# Patient Record
Sex: Female | Born: 1960 | Race: White | Hispanic: No | Marital: Married | Smoking: Current every day smoker
Health system: Southern US, Community
[De-identification: ages and names within clinical notes are randomized; demographics above are authoritative.]

## PROBLEM LIST (undated history)

## (undated) DIAGNOSIS — H539 Unspecified visual disturbance: Secondary | ICD-10-CM

## (undated) DIAGNOSIS — K769 Liver disease, unspecified: Secondary | ICD-10-CM

## (undated) DIAGNOSIS — C801 Malignant (primary) neoplasm, unspecified: Secondary | ICD-10-CM

## (undated) DIAGNOSIS — G35 Multiple sclerosis: Secondary | ICD-10-CM

## (undated) HISTORY — PX: EYE SURGERY: SHX253

## (undated) HISTORY — DX: Multiple sclerosis: G35

## (undated) HISTORY — PX: PARTIAL HYSTERECTOMY: SHX80

## (undated) HISTORY — DX: Unspecified visual disturbance: H53.9

## (undated) HISTORY — DX: Liver disease, unspecified: K76.9

---

## 2001-07-13 ENCOUNTER — Other Ambulatory Visit: Admission: RE | Admit: 2001-07-13 | Discharge: 2001-07-13 | Payer: Self-pay | Admitting: Obstetrics and Gynecology

## 2013-02-04 DIAGNOSIS — R5381 Other malaise: Secondary | ICD-10-CM | POA: Insufficient documentation

## 2013-02-04 DIAGNOSIS — E559 Vitamin D deficiency, unspecified: Secondary | ICD-10-CM | POA: Insufficient documentation

## 2013-08-17 DIAGNOSIS — F4329 Adjustment disorder with other symptoms: Secondary | ICD-10-CM | POA: Insufficient documentation

## 2016-05-28 DIAGNOSIS — G35 Multiple sclerosis: Secondary | ICD-10-CM | POA: Insufficient documentation

## 2017-01-20 ENCOUNTER — Encounter: Payer: Self-pay | Admitting: Neurology

## 2017-01-20 ENCOUNTER — Encounter (INDEPENDENT_AMBULATORY_CARE_PROVIDER_SITE_OTHER): Payer: Self-pay

## 2017-01-20 ENCOUNTER — Ambulatory Visit (INDEPENDENT_AMBULATORY_CARE_PROVIDER_SITE_OTHER): Payer: BLUE CROSS/BLUE SHIELD | Admitting: Neurology

## 2017-01-20 VITALS — BP 104/62 | HR 68 | Resp 16 | Ht 67.0 in | Wt 142.5 lb

## 2017-01-20 DIAGNOSIS — F09 Unspecified mental disorder due to known physiological condition: Secondary | ICD-10-CM | POA: Insufficient documentation

## 2017-01-20 DIAGNOSIS — R5381 Other malaise: Secondary | ICD-10-CM | POA: Diagnosis not present

## 2017-01-20 DIAGNOSIS — R269 Unspecified abnormalities of gait and mobility: Secondary | ICD-10-CM | POA: Diagnosis not present

## 2017-01-20 DIAGNOSIS — G35 Multiple sclerosis: Secondary | ICD-10-CM

## 2017-01-20 DIAGNOSIS — R5382 Chronic fatigue, unspecified: Secondary | ICD-10-CM | POA: Diagnosis not present

## 2017-01-20 DIAGNOSIS — R748 Abnormal levels of other serum enzymes: Secondary | ICD-10-CM

## 2017-01-20 DIAGNOSIS — F4329 Adjustment disorder with other symptoms: Secondary | ICD-10-CM | POA: Diagnosis not present

## 2017-01-20 MED ORDER — ARMODAFINIL 200 MG PO TABS: ORAL_TABLET | ORAL | 5 refills | Status: DC

## 2017-01-20 NOTE — Progress Notes (Signed)
[facility]  PATIENT: Michele Sawyer DOB: 10/11/1960  REFERRING DOCTOR OR PCP:  [name], PA-C SOURCE: patient, notes from Ms. Rose, imaging and laboratory reports, MRI images on PACS  _________________________________   HISTORICAL  CHIEF COMPLAINT:  Chief Complaint  Patient presents with  . Multiple Sclerosis    Michele Sawyer is here with [name] for 2nd opinion regarding MS tx.  Dx. in 2013, confirmed with MRI and LP.  Presenting sx. was fatigue, esp. in legs. Has been followed by [name], PA-C at [facility] in [place].  Initially was on Rebif; stopped after 3 yrs. due to inj. site fatigue.  Then started Gilenya, which she is still on, tolerates well and denies progression of sx./dz. Sts. lft's have been progreesively getting higher.  She has had a liver bx which was ok.  Sts. they   . High Risk Medication    would like RAS opinion on cause of high lft's--if Gilenya may be the cause and if so, if she needs to change MS meds.[place]  . Elevated LFT's    HISTORY OF PRESENT ILLNESS:  I had the pleasure seeing you patient, Michele Sawyer, at the [place] center at [facility] for a neurologic consultation regarding her multiple sclerosis and disease modifying therapies.  She is a 56 year old woman who was diagnosed with multiple sclerosis in 2012 after presenting with a several year history of progressive leg weakness with some spasticity. At times her legs would give out. This was always worse when the temperatures were elevated. She saw [facility]. She had an MRI showing changes consistent with MS. A lumbar puncture was performed and she had an elevated IgG index and greater than 5 oligoclonal bands. This helped to confirm her diagnosis of multiple sclerosis. She was started on Rebif. Although her symptoms seemed to stabilize, she had difficulty tolerating the injections noting more tired and having some skin reactions.  Around 2015, she was switched to [place]. She tolerates Gilenya well and her MS appears to be stable with no definite progression. However, she has had slowly progressive increasing alkaline phosphatase. Her levels are not that high and the last couple have been in the 150 range. Because of the progression, she was referred to gastroenterology. A liver biopsy was performed and it showed a small amount of inflammation with increased eosinophils. This was nonspecific but could be consistent with a reaction from the drug.  Initially, her's symptoms appear to be progressive starting several years before her diagnosis. She did have one episode, lasting 3 days, where she had the onset of more severe weakness in her legs while she was at work a year before diagnosis. This lasted about 3 days and then improved.   She had a second episode while she was on Rebif that also lasted a few days where her cognition was much worse and she needed to take time off work.   Gait/strength/sensation: Her main problem since before her diagnosis has been difficulty with her walking. Specifically, the right leg is weak and stiff. It gives out at times. This slowly worsened before her diagnosis but has been mostly stable with the last couple years. She is able to climb a flight of stairs if she holds on. She would have difficulty with the latter. At times she seems more off balance than other times and there is some decreased coordination, especially in the right leg. She notes some tingling in the legs but there is not a  significant discomfort.  Bladder/bowel: She reports urinary frequency and urgency and will have incontinence now and then when she can't get to the bathroom in time. She denies any bowel issues.  Vision: She denies any major difficulty with her vision. She does not have diplopia. She wears glasses.  Fatigue/sleep: She has had a lot of difficulty with fatigue in this predated her diagnosis by a couple of years. She  has both physical and cognitive fatigue. This is much worse when she is in hot temperatures and also worsens as the day goes on.   She sleeps well at night but also takes naps during the day. She does not snore or have any sleep apnea symptoms. She wakes up once a night to use the bathroom.  Mood/cognition: She has had some mild depression with some irritability. No anxiety.  She is on Effexor at relatively low dose of 37.5 most days and 75 mg some days. She has had some mild difficulties with cognition. Specifically she has had some difficulties with verbal fluency, executive function, focus and attention.  On 5 measures between 12/05/2014 and 12/22/2016, AST and ALT were within normal limits. Phosphatase was increased most of the time and progressively increase the last year 143-150-153.  Tissue specific alkaline phosphatase did not show any definite elevation though the liver was in the high normal range.    On 06/04/2015 and 07/03/2015, the absolute lymphocyte count was 0.2. On 5 measures between 12/02/2015 for 20 12/18/2016, the absolute lymphocyte count was 0.4.   Vitamin D was 56.05 May 2014.  I personally reviewed the MRI images of the brain dated 07/06/2016 and 06/13/2015. They show T2/FLAIR hyperintense foci, many in the periventricular white matter in a pattern and configuration consistent with MS. There was no change between the 2 MRIs.  REVIEW OF SYSTEMS: Constitutional: No fevers, chills, sweats, or change in appetite.  She has fatigue Eyes: No visual changes, double vision, eye pain Ear, nose and throat: No hearing loss, ear pain, nasal congestion, sore throat Cardiovascular: No chest pain, palpitations Respiratory: No shortness of breath at rest or with exertion.   No wheezes GastrointestinaI: No nausea, vomiting, diarrhea, abdominal pain, fecal incontinence Genitourinary: No dysuria, urinary retention or frequency.  No nocturia. Musculoskeletal: No neck pain, back  pain Integumentary: No rash, pruritus, skin lesions Neurological: as above Psychiatric: Notes mild depression and anxiety Endocrine: No palpitations, diaphoresis, change in appetite, change in weigh or increased thirst Hematologic/Lymphatic: No anemia, purpura, petechiae. Allergic/Immunologic: No itchy/runny eyes, nasal congestion, recent allergic reactions, rashes  ALLERGIES: Allergies  Allergen Reactions  . Penicillins Swelling    Swell rash    HOME MEDICATIONS:  Current Outpatient Prescriptions:  .  baclofen (LIORESAL) 10 MG tablet, Take 10 mg by mouth., Disp: , Rfl:  .  calcium citrate-vitamin D (CITRACAL+D) 315-200 MG-UNIT tablet, Take 1 tablet by mouth 2 (two) times daily., Disp: , Rfl:  .  Cholecalciferol (VITAMIN D3) 3000 units TABS, Take by mouth., Disp: , Rfl:  .  Fingolimod HCl (GILENYA) 0.5 MG CAPS, Take by mouth., Disp: , Rfl:  .  venlafaxine XR (EFFEXOR-XR) 37.5 MG 24 hr capsule, Take 1 capsule by mouth once daily for 1 week, then increase to 2 capsules once daily thereafter., Disp: , Rfl:  .  Armodafinil 200 MG TABS, 1/2 to 1 pill po qAm, Disp: 30 tablet, Rfl: 5  PAST MEDICAL HISTORY: Past Medical History:  Diagnosis Date  . Liver disease   . Multiple sclerosis ([place])   . Vision  abnormalities     PAST SURGICAL HISTORY: Past Surgical History:  Procedure Laterality Date  . EYE SURGERY Left    orbital floor reconstruction following softball accident  . PARTIAL HYSTERECTOMY      FAMILY HISTORY: Family History  Problem Relation Age of Onset  . Diabetes Mother   . Bone cancer Father     SOCIAL HISTORY:  Social History   Social History  . Marital status: Married    Spouse name: N/A  . Number of children: N/A  . Years of education: N/A   Occupational History  . Not on file.   Social History Main Topics  . Smoking status: Current Every Day Smoker    Types: Cigarettes  . Smokeless tobacco: Never Used  . Alcohol use No  . Drug use: No  . Sexual  activity: Not on file   Other Topics Concern  . Not on file   Social History Narrative  . No narrative on file     PHYSICAL EXAM  Vitals:   01/20/17 0907  BP: 104/62  Pulse: 68  Resp: 16  Weight: 142 lb 8 oz (64.6 kg)  Height: 5\' 7"  (1.702 m)    Body mass index is 22.32 kg/m.   General: The patient is well-developed and well-nourished and in no acute distress  Eyes:  Funduscopic exam shows normal optic discs and retinal vessels.  Neck: The neck is supple, no carotid bruits are noted.  The neck is nontender.  Cardiovascular: The heart has a regular rate and rhythm with a normal S1 and S2. There were no murmurs, gallops or rubs. Lungs are clear to auscultation.  Skin: Extremities are without significant edema.  Musculoskeletal:  Back is nontender  Neurologic Exam  Mental status: The patient is alert and oriented x 3 at the time of the examination. The patient has apparent normal recent and remote memory, with reduced normal attention span (WORLD-DLORW; 100-92?; 20-17-?).   Speech is normal.  Cranial nerves: Extraocular movements are full. Pupils are equal, round, and reactive to light and accomodation.  Visual fields are full.  Facial symmetry is present. There is good facial sensation to soft touch bilaterally.Facial strength is normal.  Trapezius and sternocleidomastoid strength is normal. No dysarthria is noted.  The tongue is midline, and the patient has symmetric elevation of the soft palate. No obvious hearing deficits are noted.  Motor:  Muscle bulk is normal.   Tone is slightly increased on the right leg. l. Strength is  5 / 5 in all 4 extremities except 4+/5 foot/toe extension on her right.   Sensory: Sensory testing is intact to pinprick, soft touch and vibration sensation in all 4 extremities.  Coordination: Cerebellar testing reveals good finger-nose-finger and heel-to-shin bilaterally.  Gait and station: Station is normal.   The gait shows a very mild right  foot drop. She has difficulty with tandem gait. Romberg is negative.   Reflexes: Deep tendon reflexes are symmetric in the arms and 3 in the left leg. She had spread at the right knee and 2 beats of nonsustained clonus at the right ankle. The right plantar response was extensor and the left response was flexor.Marland Kitchen       DIAGNOSTIC DATA (LABS, IMAGING, TESTING) - I reviewed patient records, labs, notes, testing and imaging myself where available.      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 She had slow worsening of gait for a couple of years before her diagnosis but has been mostly stable since the diagnosis. She had one possible exacerbation but most of her changes were more gradual. I discussed with her and her husband that she more likely has relapsing remitting MS but I cannot be certain that she does not have primary progressive MS.   Gilenya can lead to liver function abnormalities and this could be related, though we cannot be certain. I am going to have her cut her dose down to 1 pill every other day and to be retested in 6 - 8 weeks (I placed an order for [facility] if this helps the liver function tests and her MS remains stable, she can continue on this regimen and consider going back to a daily dose sometime in the future. If there is no change in the alkaline phosphatase, we could consider switching to a different medication. Options could include Tecfidera in ocrelizumab.  Decreased there is some possibility that her course is primary progressive, I would favor ocrelizumab if a  change of medication is required. I discussed the risks and benefits of that option.  She has several symptoms related to her MS including cognitive changes and significant fatigue. I will have her try Nuvigil to see if that can help her fatigue and sleepiness. If it does, it may also help her cognitive issues mildly. Stimulants could also be considered.  I will be happy to see her back as needed if she has significant changes or if other questions arise.  Thank you for asking me to see Michele Sawyer. Please let me know if I can be of further assistance with her or other patients in the future.   Robynn Marcel A. Felecia Shelling, MD, PhD 9/45/0388, 8:28 PM Certified in Neurology, Clinical Neurophysiology, Sleep Medicine, Pain Medicine and Neuroimaging  [facility] [address] [place], [place] [address] [phone]

## 2017-01-28 ENCOUNTER — Telehealth: Payer: Self-pay | Admitting: *Deleted

## 2017-01-28 NOTE — Telephone Encounter (Signed)
PA for Armodafinil 200mg  once daily completed and faxed to [facility] [place].  Dx. ADD F98.8/fim

## 2017-02-01 NOTE — Telephone Encounter (Signed)
I have spoken with Shayla with [employer] and advised no other meds tried or failed for poor focus/concentration related to MS/fim

## 2017-02-01 NOTE — Telephone Encounter (Signed)
Shayla/BC [phone] has a few questions reg PA.

## 2017-02-04 ENCOUNTER — Telehealth: Payer: Self-pay | Admitting: *Deleted

## 2017-02-04 NOTE — Telephone Encounter (Signed)
LMOM (identified vm) for pt. to call.  Insurance has denied Nuvigil.  Per RAS, ok for Phentermine 30mg  once daily if pt. would like. I can fax rx. to her pharmacy/fim

## 2017-02-04 NOTE — Telephone Encounter (Signed)
Received fax from [facility] [place].  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

## 2017-02-05 MED ORDER — PHENTERMINE HCL 30 MG PO CAPS: 30.0000 mg | ORAL_CAPSULE | ORAL | 5 refills | Status: DC

## 2017-02-05 NOTE — Telephone Encounter (Signed)
Rx.  faxed to [facility]/fim 

## 2017-02-05 NOTE — Telephone Encounter (Signed)
Rx. awaiting RAS sig/fim 

## 2017-02-05 NOTE — Telephone Encounter (Signed)
Pt husband calling back, he has been made aware of Phentermine 30mg  once daily  And would like to move forward.  Please process he then said he will contact [facility] to find out cost

## 2017-02-05 NOTE — Addendum Note (Signed)
Addended by: France Ravens on: 02/05/2017 10:49 AM   Modules accepted: Orders

## 2017-03-05 ENCOUNTER — Telehealth: Payer: Self-pay | Admitting: Neurology

## 2017-03-05 ENCOUNTER — Other Ambulatory Visit: Payer: Self-pay | Admitting: Neurology

## 2017-03-05 DIAGNOSIS — R748 Abnormal levels of other serum enzymes: Secondary | ICD-10-CM

## 2017-03-05 DIAGNOSIS — G35 Multiple sclerosis: Secondary | ICD-10-CM

## 2017-03-05 DIAGNOSIS — Z79899 Other long term (current) drug therapy: Secondary | ICD-10-CM

## 2017-03-05 NOTE — Telephone Encounter (Signed)
I have spoken with Clayton his afternoon.  She has been on Gilenya QOD for 6 wks--it is time to recheck lft's to see if they have come down.  Order faxed to [facility] in [place], phone# [phone], fax# [phone]/fim

## 2017-03-05 NOTE — Telephone Encounter (Signed)
Patient called and requested we send her lab work over to [facility] in [place] at [address], [place], [place] [address].

## 2017-03-06 LAB — HEPATIC FUNCTION PANEL
ALBUMIN: 4.2 g/dL (ref 3.5–5.5)
ALK PHOS: 136 IU/L — AB (ref 39–117)
ALT: 24 IU/L (ref 0–32)
AST: 23 IU/L (ref 0–40)
BILIRUBIN, DIRECT: 0.12 mg/dL (ref 0.00–0.40)
Bilirubin Total: 0.3 mg/dL (ref 0.0–1.2)
TOTAL PROTEIN: 6.8 g/dL (ref 6.0–8.5)

## 2017-03-12 ENCOUNTER — Telehealth: Payer: Self-pay | Admitting: *Deleted

## 2017-03-12 NOTE — Telephone Encounter (Signed)
-----   Message from Britt Bottom, MD sent at 03/11/2017  4:46 PM EDT ----- The liver function tests look okay. The alkaline phosphatase was minimally elevated but this is not concerning.

## 2017-03-12 NOTE — Telephone Encounter (Signed)
I have spoken with Michele Sawyer and per RAS, reviewed lab results as below.  As she is continuing care with [name], PA-C and [name], I have faxed results to [facility] at fax# [phone]

## 2020-04-25 ENCOUNTER — Emergency Department (HOSPITAL_COMMUNITY): Payer: Medicare Other

## 2020-04-25 ENCOUNTER — Other Ambulatory Visit: Payer: Self-pay

## 2020-04-25 ENCOUNTER — Encounter (HOSPITAL_COMMUNITY): Payer: Self-pay

## 2020-04-25 ENCOUNTER — Inpatient Hospital Stay (HOSPITAL_COMMUNITY)
Admission: EM | Admit: 2020-04-25 | Discharge: 2020-04-30 | DRG: 054 | Disposition: A | Discharge status: Hospice | Payer: Medicare Other | Attending: Internal Medicine | Admitting: Internal Medicine

## 2020-04-25 DIAGNOSIS — G35 Multiple sclerosis: Secondary | ICD-10-CM | POA: Diagnosis present

## 2020-04-25 DIAGNOSIS — R569 Unspecified convulsions: Secondary | ICD-10-CM

## 2020-04-25 DIAGNOSIS — C7971 Secondary malignant neoplasm of right adrenal gland: Secondary | ICD-10-CM | POA: Diagnosis present

## 2020-04-25 DIAGNOSIS — Z90711 Acquired absence of uterus with remaining cervical stump: Secondary | ICD-10-CM

## 2020-04-25 DIAGNOSIS — R29810 Facial weakness: Secondary | ICD-10-CM | POA: Diagnosis present

## 2020-04-25 DIAGNOSIS — R4701 Aphasia: Secondary | ICD-10-CM | POA: Diagnosis present

## 2020-04-25 DIAGNOSIS — R531 Weakness: Secondary | ICD-10-CM

## 2020-04-25 DIAGNOSIS — M84459A Pathological fracture, hip, unspecified, initial encounter for fracture: Secondary | ICD-10-CM | POA: Diagnosis present

## 2020-04-25 DIAGNOSIS — C801 Malignant (primary) neoplasm, unspecified: Secondary | ICD-10-CM | POA: Diagnosis present

## 2020-04-25 DIAGNOSIS — E872 Acidosis: Secondary | ICD-10-CM | POA: Diagnosis present

## 2020-04-25 DIAGNOSIS — Z978 Presence of other specified devices: Secondary | ICD-10-CM

## 2020-04-25 DIAGNOSIS — Z4659 Encounter for fitting and adjustment of other gastrointestinal appliance and device: Secondary | ICD-10-CM

## 2020-04-25 DIAGNOSIS — G9341 Metabolic encephalopathy: Secondary | ICD-10-CM | POA: Diagnosis present

## 2020-04-25 DIAGNOSIS — E162 Hypoglycemia, unspecified: Secondary | ICD-10-CM | POA: Diagnosis not present

## 2020-04-25 DIAGNOSIS — Z20822 Contact with and (suspected) exposure to covid-19: Secondary | ICD-10-CM | POA: Diagnosis present

## 2020-04-25 DIAGNOSIS — Z833 Family history of diabetes mellitus: Secondary | ICD-10-CM

## 2020-04-25 DIAGNOSIS — G40901 Epilepsy, unspecified, not intractable, with status epilepticus: Secondary | ICD-10-CM

## 2020-04-25 DIAGNOSIS — Z681 Body mass index (BMI) 19 or less, adult: Secondary | ICD-10-CM

## 2020-04-25 DIAGNOSIS — J9601 Acute respiratory failure with hypoxia: Secondary | ICD-10-CM | POA: Diagnosis present

## 2020-04-25 DIAGNOSIS — R739 Hyperglycemia, unspecified: Secondary | ICD-10-CM | POA: Diagnosis present

## 2020-04-25 DIAGNOSIS — Z923 Personal history of irradiation: Secondary | ICD-10-CM

## 2020-04-25 DIAGNOSIS — C7931 Secondary malignant neoplasm of brain: Secondary | ICD-10-CM | POA: Diagnosis not present

## 2020-04-25 DIAGNOSIS — Z7189 Other specified counseling: Secondary | ICD-10-CM

## 2020-04-25 DIAGNOSIS — E876 Hypokalemia: Secondary | ICD-10-CM | POA: Diagnosis present

## 2020-04-25 DIAGNOSIS — Z66 Do not resuscitate: Secondary | ICD-10-CM | POA: Diagnosis not present

## 2020-04-25 DIAGNOSIS — C787 Secondary malignant neoplasm of liver and intrahepatic bile duct: Secondary | ICD-10-CM | POA: Diagnosis present

## 2020-04-25 DIAGNOSIS — I639 Cerebral infarction, unspecified: Secondary | ICD-10-CM | POA: Diagnosis present

## 2020-04-25 DIAGNOSIS — R509 Fever, unspecified: Secondary | ICD-10-CM

## 2020-04-25 DIAGNOSIS — G893 Neoplasm related pain (acute) (chronic): Secondary | ICD-10-CM | POA: Diagnosis present

## 2020-04-25 DIAGNOSIS — F17210 Nicotine dependence, cigarettes, uncomplicated: Secondary | ICD-10-CM | POA: Diagnosis present

## 2020-04-25 DIAGNOSIS — F329 Major depressive disorder, single episode, unspecified: Secondary | ICD-10-CM | POA: Diagnosis present

## 2020-04-25 DIAGNOSIS — R64 Cachexia: Secondary | ICD-10-CM | POA: Diagnosis present

## 2020-04-25 DIAGNOSIS — R29711 NIHSS score 11: Secondary | ICD-10-CM | POA: Diagnosis present

## 2020-04-25 DIAGNOSIS — E8809 Other disorders of plasma-protein metabolism, not elsewhere classified: Secondary | ICD-10-CM | POA: Diagnosis present

## 2020-04-25 DIAGNOSIS — G936 Cerebral edema: Secondary | ICD-10-CM | POA: Diagnosis present

## 2020-04-25 DIAGNOSIS — C7951 Secondary malignant neoplasm of bone: Secondary | ICD-10-CM | POA: Diagnosis present

## 2020-04-25 DIAGNOSIS — Z515 Encounter for palliative care: Secondary | ICD-10-CM | POA: Diagnosis not present

## 2020-04-25 DIAGNOSIS — C7802 Secondary malignant neoplasm of left lung: Secondary | ICD-10-CM | POA: Diagnosis present

## 2020-04-25 DIAGNOSIS — Z880 Allergy status to penicillin: Secondary | ICD-10-CM

## 2020-04-25 DIAGNOSIS — G40401 Other generalized epilepsy and epileptic syndromes, not intractable, with status epilepticus: Secondary | ICD-10-CM | POA: Diagnosis present

## 2020-04-25 DIAGNOSIS — Z79899 Other long term (current) drug therapy: Secondary | ICD-10-CM

## 2020-04-25 DIAGNOSIS — D630 Anemia in neoplastic disease: Secondary | ICD-10-CM | POA: Diagnosis present

## 2020-04-25 DIAGNOSIS — R471 Dysarthria and anarthria: Secondary | ICD-10-CM | POA: Diagnosis present

## 2020-04-25 HISTORY — DX: Malignant (primary) neoplasm, unspecified: C80.1

## 2020-04-25 LAB — CBC
HCT: 32.6 % — ABNORMAL LOW (ref 36.0–46.0)
Hemoglobin: 10.4 g/dL — ABNORMAL LOW (ref 12.0–15.0)
MCH: 30.9 pg (ref 26.0–34.0)
MCHC: 31.9 g/dL (ref 30.0–36.0)
MCV: 96.7 fL (ref 80.0–100.0)
Platelets: 227 10*3/uL (ref 150–400)
RBC: 3.37 MIL/uL — ABNORMAL LOW (ref 3.87–5.11)
RDW: 17 % — ABNORMAL HIGH (ref 11.5–15.5)
WBC: 5.7 10*3/uL (ref 4.0–10.5)
nRBC: 0 % (ref 0.0–0.2)

## 2020-04-25 LAB — COMPREHENSIVE METABOLIC PANEL
ALT: 10 U/L (ref 0–44)
AST: 16 U/L (ref 15–41)
Albumin: 2.8 g/dL — ABNORMAL LOW (ref 3.5–5.0)
Alkaline Phosphatase: 153 U/L — ABNORMAL HIGH (ref 38–126)
Anion gap: 10 (ref 5–15)
BUN: 12 mg/dL (ref 8–23)
CO2: 24 mmol/L (ref 22–32)
Calcium: 8.2 mg/dL — ABNORMAL LOW (ref 8.9–10.3)
Chloride: 104 mmol/L (ref 98–111)
Creatinine, Ser: 0.4 mg/dL — ABNORMAL LOW (ref 0.44–1.00)
GFR calc Af Amer: >60 mL/min (ref >60)
GFR calc non Af Amer: >60 mL/min (ref >60)
Glucose, Bld: 107 mg/dL — ABNORMAL HIGH (ref 70–99)
Potassium: 3 mmol/L — ABNORMAL LOW (ref 3.5–5.1)
Sodium: 138 mmol/L (ref 135–145)
Total Bilirubin: 0.5 mg/dL (ref 0.3–1.2)
Total Protein: 5.8 g/dL — ABNORMAL LOW (ref 6.5–8.1)

## 2020-04-25 LAB — DIFFERENTIAL
Abs Immature Granulocytes: 0.08 10*3/uL — ABNORMAL HIGH (ref 0.00–0.07)
Basophils Absolute: 0 10*3/uL (ref 0.0–0.1)
Basophils Relative: 0 %
Eosinophils Absolute: 0.1 10*3/uL (ref 0.0–0.5)
Eosinophils Relative: 1 %
Immature Granulocytes: 1 %
Lymphocytes Relative: 6 %
Lymphs Abs: 0.3 10*3/uL — ABNORMAL LOW (ref 0.7–4.0)
Monocytes Absolute: 0.5 10*3/uL (ref 0.1–1.0)
Monocytes Relative: 9 %
Neutro Abs: 4.7 10*3/uL (ref 1.7–7.7)
Neutrophils Relative %: 83 %

## 2020-04-25 LAB — I-STAT CHEM 8, ED
BUN: 12 mg/dL (ref 8–23)
Calcium, Ion: 1.23 mmol/L (ref 1.15–1.40)
Chloride: 101 mmol/L (ref 98–111)
Creatinine, Ser: 0.4 mg/dL — ABNORMAL LOW (ref 0.44–1.00)
Glucose, Bld: 101 mg/dL — ABNORMAL HIGH (ref 70–99)
HCT: 33 % — ABNORMAL LOW (ref 36.0–46.0)
Hemoglobin: 11.2 g/dL — ABNORMAL LOW (ref 12.0–15.0)
Potassium: 3.6 mmol/L (ref 3.5–5.1)
Sodium: 142 mmol/L (ref 135–145)
TCO2: 30 mmol/L (ref 22–32)

## 2020-04-25 LAB — URINALYSIS, ROUTINE W REFLEX MICROSCOPIC
Bilirubin Urine: NEGATIVE
Glucose, UA: NEGATIVE mg/dL
Ketones, ur: NEGATIVE mg/dL
Nitrite: NEGATIVE
Protein, ur: NEGATIVE mg/dL
RBC / HPF: >50 RBC/hpf — ABNORMAL HIGH (ref 0–5)
Specific Gravity, Urine: 1.016 (ref 1.005–1.030)
pH: 7 (ref 5.0–8.0)

## 2020-04-25 LAB — PROTIME-INR
INR: 1 (ref 0.8–1.2)
Prothrombin Time: 12.6 seconds (ref 11.4–15.2)

## 2020-04-25 LAB — RAPID URINE DRUG SCREEN, HOSP PERFORMED
Amphetamines: NOT DETECTED
Barbiturates: NOT DETECTED
Benzodiazepines: POSITIVE — AB
Cocaine: NOT DETECTED
Opiates: NOT DETECTED
Tetrahydrocannabinol: NOT DETECTED

## 2020-04-25 LAB — ETHANOL: Alcohol, Ethyl (B): <10 mg/dL (ref <10)

## 2020-04-25 LAB — APTT: aPTT: 25 seconds (ref 24–36)

## 2020-04-25 LAB — CBG MONITORING, ED: Glucose-Capillary: 90 mg/dL (ref 70–99)

## 2020-04-25 LAB — SARS CORONAVIRUS 2 BY RT PCR (HOSPITAL ORDER, PERFORMED IN ~~LOC~~ HOSPITAL LAB): SARS Coronavirus 2: NEGATIVE

## 2020-04-25 MED ORDER — LORAZEPAM 2 MG/ML IJ SOLN
1.0000 mg | Freq: Once | INTRAMUSCULAR | Status: DC
  Filled 2020-04-25: qty 1

## 2020-04-25 MED ORDER — LEVETIRACETAM IN NACL 1000 MG/100ML IV SOLN
1000.0000 mg | Freq: Two times a day (BID) | INTRAVENOUS | Status: DC
  Administered 2020-04-26 – 2020-04-30 (×10): 1000 mg via INTRAVENOUS
  Filled 2020-04-25 (×12): qty 100

## 2020-04-25 MED ORDER — PROPOFOL 1000 MG/100ML IV EMUL
INTRAVENOUS | Status: AC
  Filled 2020-04-25: qty 100

## 2020-04-25 MED ORDER — LORAZEPAM 2 MG/ML IJ SOLN: 2.0000 mg | Freq: Once | INTRAMUSCULAR | Status: AC

## 2020-04-25 MED ORDER — LEVETIRACETAM IN NACL 1000 MG/100ML IV SOLN
1000.0000 mg | INTRAVENOUS | Status: AC
  Administered 2020-04-25: 1000 mg via INTRAVENOUS

## 2020-04-25 MED ORDER — LEVETIRACETAM IN NACL 1500 MG/100ML IV SOLN
1500.0000 mg | Freq: Once | INTRAVENOUS | Status: AC
  Administered 2020-04-25: 1500 mg via INTRAVENOUS

## 2020-04-25 MED ORDER — LORAZEPAM 2 MG/ML IJ SOLN
INTRAMUSCULAR | Status: AC
  Filled 2020-04-25: qty 1

## 2020-04-25 MED ORDER — SODIUM CHLORIDE 0.9 % IV SOLN: 2000.0000 mg | Freq: Once | INTRAVENOUS | Status: DC

## 2020-04-25 MED ORDER — LORAZEPAM 2 MG/ML IJ SOLN
INTRAMUSCULAR | Status: AC
  Administered 2020-04-25: 2 mg via INTRAVENOUS
  Filled 2020-04-25: qty 1

## 2020-04-25 MED ORDER — LEVETIRACETAM IN NACL 1000 MG/100ML IV SOLN: 1000.0000 mg | Freq: Once | INTRAVENOUS | Status: DC

## 2020-04-25 MED ORDER — DEXAMETHASONE SODIUM PHOSPHATE 10 MG/ML IJ SOLN
10.0000 mg | Freq: Once | INTRAMUSCULAR | Status: AC
  Administered 2020-04-25: 10 mg via INTRAVENOUS
  Filled 2020-04-25: qty 1

## 2020-04-25 MED ORDER — LORAZEPAM 2 MG/ML IJ SOLN
2.0000 mg | Freq: Once | INTRAMUSCULAR | Status: AC
  Administered 2020-04-25: 2 mg via INTRAVENOUS

## 2020-04-25 MED ORDER — LEVETIRACETAM IN NACL 1000 MG/100ML IV SOLN
INTRAVENOUS | Status: AC
  Filled 2020-04-25: qty 100

## 2020-04-25 MED ORDER — LORAZEPAM 2 MG/ML IJ SOLN
1.0000 mg | Freq: Once | INTRAMUSCULAR | Status: AC
  Administered 2020-04-25: 1 mg via INTRAVENOUS

## 2020-04-25 MED ORDER — IOHEXOL 350 MG/ML SOLN: 60.0000 mL | Freq: Once | INTRAVENOUS | Status: DC | PRN

## 2020-04-25 MED ORDER — PROPOFOL 1000 MG/100ML IV EMUL
5.0000 ug/kg/min | INTRAVENOUS | Status: DC
  Administered 2020-04-25: 5 ug/kg/min via INTRAVENOUS
  Administered 2020-04-26 (×3): 45 ug/kg/min via INTRAVENOUS
  Administered 2020-04-27: 40 ug/kg/min via INTRAVENOUS
  Administered 2020-04-27 (×2): 25 ug/kg/min via INTRAVENOUS
  Filled 2020-04-25 (×2): qty 100
  Filled 2020-04-25: qty 200
  Filled 2020-04-25 (×4): qty 100

## 2020-04-25 NOTE — ED Notes (Signed)
23 at the teeth  7.5 ETT  All inserted by Dr. Sabra Heck

## 2020-04-25 NOTE — ED Notes (Signed)
Dr. Sabra Heck and RT Abigail Butts at bedside as well

## 2020-04-25 NOTE — ED Provider Notes (Addendum)
[facility] EMERGENCY DEPARTMENT Provider Note   CSN: [id number] Arrival date & time: 04/25/20  1408  An emergency department physician performed an initial assessment on this suspected stroke patient at 1425.  History Chief Complaint  Patient presents with   Seizures    Michele Sawyer is a 59 y.o. female.  Presents with EMS for new seizure activity.  Approximately 130 patient had generalized seizure with right gaze, patient had 3 seizures total since arrival generalized.  No history of stroke or seizure.  Patient has history of MS and metastatic adenocarcinoma per report.  Unable to get details from patient on arrival no family at bedside.  Patient received Versed 2.5 mg twice for EMS.        Past Medical History:  Diagnosis Date   Cancer [facility])    metastatic adenocarcinoma   Liver disease    Multiple sclerosis ([place])    Vision abnormalities     Patient Active Problem List   Diagnosis Date Noted   Gait disturbance 01/20/2017   Cognitive deficit secondary to multiple sclerosis ([place]) 01/20/2017   Elevated alkaline phosphatase level 01/20/2017   Multiple sclerosis ([place]) 05/28/2016   Mixed emotional features as adjustment reaction 08/17/2013   Chronic fatigue and malaise 02/04/2013   Vitamin D deficiency 02/04/2013       OB History   No obstetric history on file.     Family History  Problem Relation Age of Onset   Diabetes Mother    Bone cancer Father     Social History   Tobacco Use   Smoking status: Current Every Day Smoker    Types: Cigarettes   Smokeless tobacco: Never Used  Substance Use Topics   Alcohol use: No   Drug use: No    Home Medications Prior to Admission medications   Medication Sig Start Date End Date Taking? Authorizing Provider  baclofen (LIORESAL) 10 MG tablet Take 10 mg by mouth. 03/20/16   [provider]  calcium citrate-vitamin D (CITRACAL+D) 315-200 MG-UNIT tablet Take 1 tablet by mouth 2 (two) times  daily.    [provider]  Cholecalciferol (VITAMIN D3) 3000 units TABS Take by mouth. 08/17/13   [provider]  Fingolimod HCl (GILENYA) 0.5 MG CAPS Take by mouth. 01/08/17   [provider]  phentermine 30 MG capsule Take 1 capsule (30 mg total) by mouth every morning. 02/05/17   Sater, Nanine Means, MD  venlafaxine XR (EFFEXOR-XR) 37.5 MG 24 hr capsule Take 1 capsule by mouth once daily for 1 week, then increase to 2 capsules once daily thereafter. 03/20/16   [provider]    Allergies    Penicillins  Review of Systems   Review of Systems  Unable to perform ROS: Patient unresponsive    Physical Exam Updated Vital Signs BP 122/64 (BP Location: Left Arm)    Pulse 88    Temp 98 F (36.7 C) (Oral)    Resp 16    Wt 54.4 kg    SpO2 98%    BMI 18.79 kg/m   Physical Exam Vitals and nursing note reviewed.  Constitutional:      Appearance: She is well-developed.  HENT:     Head: Normocephalic and atraumatic.  Eyes:     General:        Right eye: No discharge.        Left eye: No discharge.     Conjunctiva/sclera: Conjunctivae normal.  Neck:     Trachea: No tracheal deviation.  Cardiovascular:     Rate and Rhythm: Normal rate and regular rhythm.  Pulmonary:     Effort: Pulmonary effort is normal.     Breath sounds: Normal breath sounds.  Abdominal:     General: There is no distension.     Palpations: Abdomen is soft.     Tenderness: There is no abdominal tenderness. There is no guarding.  Musculoskeletal:        General: No swelling.     Cervical back: Normal range of motion and neck supple.  Skin:    General: Skin is warm.     Findings: No rash.  Neurological:     Mental Status: She is alert.     Comments: Patient no verbal response, will not follow commands.  General weakness.  Pupils equal bilateral with mild left gaze and mild right facial droop.  Psychiatric:     Comments: Altered     ED Results / Procedures / Treatments    Labs (all labs ordered are listed, but only abnormal results are displayed) Labs Reviewed  I-STAT CHEM 8, ED - Abnormal; Notable for the following components:      Result Value   Creatinine, Ser 0.40 (*)    Glucose, Bld 101 (*)    Hemoglobin 11.2 (*)    HCT 33.0 (*)    All other components within normal limits  SARS CORONAVIRUS 2 BY RT PCR (HOSPITAL ORDER, [place] [facility])  ETHANOL  PROTIME-INR  APTT  CBC  DIFFERENTIAL  COMPREHENSIVE METABOLIC PANEL  RAPID URINE DRUG SCREEN, HOSP PERFORMED  URINALYSIS, ROUTINE W REFLEX MICROSCOPIC  CBG MONITORING, ED    EKG None  Radiology CT HEAD CODE STROKE WO CONTRAST  Result Date: 04/25/2020 CLINICAL DATA:  Code stroke. Right facial droop, suspected new seizure, history of MS and metastatic adenocarcinoma of unknown primary EXAM: CT HEAD WITHOUT CONTRAST TECHNIQUE: Contiguous axial images were obtained from the base of the skull through the vertex without intravenous contrast. COMPARISON:  Correlation made with prior MRI from 2020 FINDINGS: Motion artifact is present despite repeat scanning. Brain: There is a mildly hyperdense lesion of the parasagittal left parietal lobe near the calcarine sulcus measuring 1 cm. Surrounding hypoattenuation in the white matter of the parietal lobe likely reflects associated vasogenic edema. Mass effect is minor. No acute intracranial hemorrhage. Gray-white differentiation appears preserved within above limitation. Prominence of the ventricles and sulci reflects generalized parenchymal volume loss. Patchy and confluent areas of hypoattenuation in the supratentorial white matter nonspecific but probably reflect chronic demyelination and microvascular ischemic changes. Vascular: No hyperdense vessel. Skull: Unremarkable. Sinuses/Orbits: No acute abnormality. Other: Mastoid air cells are clear. IMPRESSION: Motion degraded. 1 cm lesion of the parasagittal left parietal lobe with surrounding edema  and minor mass effect. Most consistent with a metastasis. Contrast enhanced MRI is recommended for further evaluation. These results were called by telephone at the time of interpretation on 04/25/2020 at 2:55 pm to provider [facility] , who verbally acknowledged these results. Electronically Signed   By: Macy Mis M.D.   On: 04/25/2020 15:00    Procedures .Critical Care Performed by: Elnora Morrison, MD Authorized by: Elnora Morrison, MD   Critical care provider statement:    Critical care time (minutes):  75   Critical care start time:  04/25/2020 2:25 PM   Critical care end time:  04/25/2020 3:40 PM   Critical care time was exclusive of:  Separately billable procedures and treating other patients and teaching time  Critical care was necessary to treat or prevent imminent or life-threatening deterioration of the following conditions:  CNS failure or compromise   Critical care was time spent personally by me on the following activities:  Discussions with consultants, evaluation of patient's response to treatment, examination of patient, ordering and performing treatments and interventions, ordering and review of laboratory studies, ordering and review of radiographic studies, pulse oximetry, re-evaluation of patient's condition and review of old charts   (including critical care time)  Medications Ordered in ED Medications  iohexol (OMNIPAQUE) 350 MG/ML injection 60 mL (has no administration in time range)  levETIRAcetam (KEPPRA) IVPB 1000 mg/100 mL premix (1,000 mg Intravenous New Bag/Given 04/25/20 1543)  levETIRAcetam (KEPPRA) IVPB 1500 mg/ 100 mL premix (0 mg Intravenous Stopped 04/25/20 1500)  LORazepam (ATIVAN) injection 1 mg (1 mg Intravenous Given 04/25/20 1427)  LORazepam (ATIVAN) injection 2 mg (2 mg Intravenous Given 04/25/20 1503)  LORazepam (ATIVAN) injection 2 mg (2 mg Intravenous Given 04/25/20 1536)  dexamethasone (DECADRON) injection 10 mg (10 mg Intravenous Given 04/25/20  1543)  LORazepam (ATIVAN) injection 2 mg (2 mg Intravenous Given 04/25/20 1544)    ED Course  I have reviewed the triage vital signs and the nursing notes.  Pertinent labs & imaging results that were available during my care of the patient were reviewed by me and considered in my medical decision making (see chart for details).    MDM Rules/Calculators/A&P                          Patient with history of cancer and MS presents with recurrent seizures and altered mental status.  Differential diagnosis includes metastasis, stroke, MS lesion, other.  Glucose checked and normal, hemoglobin 11.2.  Patient on exam still having mild right leg shaking and mild gaze deviation, concern for status epilepticus.  Repeat Ativan ordered, Keppra ordered.  Discussed CT results with radiology concerning for mild edema and metastasis.  Remainder blood work pending.  Covid test sent for admission.  Plan for neuro consult and admission to medicine.  Discussed with hospitalist and no continuous EEG available especially in the evening, page neuro hospitalist at [facility] to assist with transfer and admission.  Repeat Keppra dose 2 g ordered and repeat 2 mg Ativan IV for total of 7 mg.  Patient intermittently agitated..  Discussed findings, plan and severity of presentation with patient's husband.    Final Clinical Impression(s) / ED Diagnoses Final diagnoses:  Brain metastasis [facility])  Status epilepticus [facility])    Rx / DC Orders ED Discharge Orders    None       Elnora Morrison, MD 04/25/20 [place]    Elnora Morrison, MD 04/25/20 [phone]

## 2020-04-25 NOTE — ED Notes (Signed)
Pt transported back to CT 

## 2020-04-25 NOTE — ED Notes (Signed)
Pt conts be tensing on right side EDP recommends more ativan due to pt appearing to continue to have seizure

## 2020-04-25 NOTE — ED Notes (Signed)
Pt returned from CT °

## 2020-04-25 NOTE — ED Notes (Signed)
Etomidate in at 1629 20mg  Rocuronium 50mg  at 1630

## 2020-04-25 NOTE — ED Triage Notes (Signed)
Pt brought in by EMS due seizure   Husband reports that she was sitting in chair having a grand mal seizure lasting approx 2 mins. Approx 1330. EMS noted right facial droop. Pt had 2 witnessed seizures with EMS lasting one min per seizure. Body was tense and gazing to right . Pt unresponsive. EDP at bedside. Pt given versad 2.5 x 2 IV x EMS

## 2020-04-25 NOTE — Progress Notes (Signed)
CT CODE STROKE TIMES 1425 CALL TIME 1425 BEEPER TIME 1441 EXAM STARTED 1445 EXAM FINISHED 1445 IMAGES SENT TO SOC 1445 EXAM COMPETED IN EPIC 1445 GRA CALLED

## 2020-04-25 NOTE — ED Provider Notes (Signed)
At change of shift this very ill patient was accepted pending placement, likely needing ICU level care.  This unfortunate patient is currently being treated for seizures, she has been found to have metastatic disease to the left parietal lobe, 1 cm lesion with some surrounding edema, the patient does not appear to be seizing any longer however she is using her arms to try to pull off covers and pull out IVs, she is intermittently bradycardia up neck and not breathing adequately.  She is requiring increased sedation and at this point with her mental status I fear that more sedation would cause her to be apneic.  I discussed her care with her husband, the hospitalist Dr. Denton Brick is also seen the patient at the bedside and all are in agreement that this patient likely needs to be intubated for airway protection and adequate sedation, transferred to intensive care unit.  She does have a Port-A-Cath in her right chest however the husband states that it is not been drawing blood appropriately.  I will place a central line as well, all of these procedures were discussed with the husband at length and he is agreeable to all of these verbally.  Michele Sawyer was evaluated in Emergency Department on 04/25/2020 for the symptoms described in the history of present illness. She was evaluated in the context of the global COVID-19 pandemic, which necessitated consideration that the patient might be at risk for infection with the SARS-CoV-2 virus that causes COVID-19. Institutional protocols and algorithms that pertain to the evaluation of patients at risk for COVID-19 are in a state of rapid change based on information released by regulatory bodies including the CDC and federal and state organizations. These policies and algorithms were followed during the patient's care in the ED.   Procedure Name: Intubation Date/Time: 04/25/2020 4:53 PM Performed by: Noemi Chapel, MD Pre-anesthesia Checklist: Patient identified,  Timeout performed, Emergency Drugs available, Suction available and Patient being monitored Oxygen Delivery Method: Non-rebreather mask Preoxygenation: Pre-oxygenation with 100% oxygen Induction Type: Rapid sequence Ventilation: Mask ventilation without difficulty Laryngoscope Size: Mac and 4 Grade View: Grade III Tube size: 7.5 mm Number of attempts: 1 Airway Equipment and Method: Stylet Placement Confirmation: ETT inserted through vocal cords under direct vision,  Positive ETCO2,  CO2 detector and Breath sounds checked- equal and bilateral Secured at: 23 cm Tube secured with: ETT holder Dental Injury: Teeth and Oropharynx as per pre-operative assessment  Difficulty Due To: Difficulty was unanticipated Comments:      .Central Line  Date/Time: 04/25/2020 4:54 PM Performed by: Noemi Chapel, MD Authorized by: Noemi Chapel, MD   Consent:    Consent obtained:  Verbal   Consent given by:  Spouse   Risks discussed:  Incorrect placement, arterial puncture, bleeding and nerve damage   Alternatives discussed:  Alternative treatment Universal protocol:    Test results available and properly labeled: yes     Imaging studies available: yes     Required blood products, implants, devices, and special equipment available: yes     Site/side marked: yes     Immediately prior to procedure, a time out was called: yes     Patient identity confirmed:  Arm band Pre-procedure details:    Hand hygiene: Hand hygiene performed prior to insertion     Sterile barrier technique: All elements of maximal sterile technique followed     Skin preparation:  ChloraPrep   Skin preparation agent: Skin preparation agent completely dried prior to procedure   Anesthesia (see  MAR for exact dosages):    Anesthesia method:  None Procedure details:    Location:  R femoral   Patient position:  Flat   Procedural supplies:  Triple lumen   Catheter size:  7 Fr   Landmarks identified: yes     Ultrasound guidance:  no     Number of attempts:  1   Successful placement: yes   Post-procedure details:    Post-procedure:  Dressing applied and line sutured   Assessment:  Blood return through all ports and free fluid flow   Patient tolerance of procedure:  Tolerated well, no immediate complications Comments:       OG placement  Date/Time: 04/25/2020 4:56 PM Performed by: Noemi Chapel, MD Authorized by: Noemi Chapel, MD  Preparation: Patient was prepped and draped in the usual sterile fashion. Local anesthesia used: no  Anesthesia: Local anesthesia used: no  Sedation: Patient sedated: yes Sedatives: propofol  Patient tolerance: patient tolerated the procedure well with no immediate complications Comments:  The patient had sedation for intubation, this was not procedural sedation for procedural placement of OG tube     Discussed the case with Dr. Cheral Marker of the neurology service who recommends ongoing treatment with Keppra 1000 mg twice a day, he recommends the patient be admitted to the ICU at [facility], discussed with Dr. Tacy Learn who has accepted the patient, there is no beds available at this time, the patient appears hemodynamically stable, she will continue to get the [place] as prescribed pending bed transfer.    Noemi Chapel, MD 04/26/20 [phone]

## 2020-04-25 NOTE — Consult Note (Addendum)
[facility] Consult Services   Date of Service:   04/25/2020 15:10:57  Comments/Sign-Out: 59 year old female with a history of MS and metastatic adenocarcinoma who presents the emergency department today with clinical seizures without return to baseline. She appears to be in status epilepticus with intermittent right arm clenching and shaking, and she is unresponsive although is moving the left side spontaneously and appears to be agitated. I would recommend continue to treat for status epilepticus including more Keppra, as well as more Ativan. Can also consider fosphenytoin load if that does not work. However, she may need to be intubated for airway support as well as sedation with either Versed or propofol. Recommend stat EEG and trend. She will need an MRI scan with and without contrast at some point. It appears that her last known well is probably a few days ago. However, I would recommend getting a CT angiogram and perfusion when possible just to make sure she has not had a large vessel occlusion.  Metrics: Last Known Well: Unknown [facility] Notification Time: 04/25/2020 15:10:57 Arrival Time: 04/25/2020 14:08:00 Stamp Time: 04/25/2020 15:10:57 Time First Login Attempt: 04/25/2020 15:13:24 Symptoms: Seizure. NIHSS Start Assessment Time: 04/25/2020 15:28:00 Patient is not a candidate for Thrombolytic. Thrombolytic Medical Decision: 04/25/2020 15:19:33 Patient was not deemed candidate for Thrombolytic because of following reasons: Last Well Known Above 4.5 Hours. Intracranial Intra-Axial Neoplasm.  CT head was reviewed and results were: IMPRESSION: Motion degraded.   1 cm lesion of the parasagittal left parietal lobe with surrounding edema and minor mass effect. Most consistent with a metastasis. Contrast enhanced MRI is recommended for further evaluation.  ED Physician notified of diagnostic impression and management plan on 04/25/2020  15:33:54  Advanced Imaging: CTA Head and Neck Recommended:  CTP Recommended:   Our recommendations are outlined below.  Recommendations:     .  Activate Stroke Protocol Admission/Order Set     .  Stroke/Telemetry Floor     .  Neuro Checks     .  Bedside Swallow Eval     .  DVT Prophylaxis     .  IV Fluids, Normal Saline     .  Head of Bed 30 Degrees     .  Euglycemia and Avoid Hyperthermia (PRN Acetaminophen)     .  2 g Keppra load     .  2 mg Ativan now     .  Stat EEG and trend     .  CT angiogram and perfusion when able and clinically stable     .  MRI brain with and without contrast when clinically stable     .  10 mg dexamethasone, followed by 4 mg every 6 for intracranial metastasis seen on CT     .  Neurology follow-up  Routine Consultation with [facility] for Follow up Care  Sign Out:     .  Discussed with Emergency Department Provider    ------------------------------------------------------------------------------  History of Present Illness: Patient is a 59 year old Female.  Patient was brought by EMS for symptoms of Seizure.  55F h/o MS, metastatic adenomcarcinoma initially found in the femur but now mets throughout the body although no known history of brain mets per husband. She apparently has not been feeling well over the past few days has been more fatigued and more lethargic. Presents with seizure at home. Apparently was home with husband and had a GTC. EMS came, witnessed two more. Given versed. Given ativan and keppra on arrival to  ED. CTH showed mass left parasagittal parietal lobe with surrounding vasogenic edema concerning for possible met. Her seizure was concerning for right gaze deviation, right hemiparesis. Per husband, patient has been not feeling well over the past few days.   Past Medical History:     . MS Metastatic adenocarcinoma, recent scans  Social History: Smoking: Yes      Examination: BP(122/64), Pulse(88), Blood  Glucose(90) 1A: Level of Consciousness - Requires repeated stimulation to arouse + 2 1B: Ask Month and Age - Aphasic + 2 1C: Blink Eyes & Squeeze Hands - Performs 0 Tasks + 2 2: Test Horizontal Extraocular Movements - Normal + 0 3: Test Visual Fields - No Visual Loss + 0 4: Test Facial Palsy (Use Grimace if Obtunded) - Normal symmetry + 0 5A: Test Left Arm Motor Drift - No Drift for 10 Seconds + 0 5B: Test Right Arm Motor Drift - No Drift for 10 Seconds + 0 6A: Test Left Leg Motor Drift - No Drift for 5 Seconds + 0 6B: Test Right Leg Motor Drift - No Drift for 5 Seconds + 0 7: Test Limb Ataxia (FNF/Heel-Shin) - Does Not Understand + 0 8: Test Sensation - Normal; No sensory loss + 0 9: Test Language/Aphasia - Mute/Global Aphasia: No Usable Speech/Auditory Comprehension + 3 10: Test Dysarthria - Mute/Anarthric + 2 11: Test Extinction/Inattention - No abnormality + 0  NIHSS Score: 11 Pre-Morbid Modified Rankin Scale: 3 Points = Moderate disability; requiring some help, but able to walk without assistance     Patient is being evaluated for possible acute neurologic impairment and high probability of imminent or life-threatening deterioration. I spent total of 35 minutes providing care to this patient, including time for face to face visit via telemedicine, review of medical records, imaging studies and discussion of findings with providers, the patient and/or family.   Dr Knox Royalty   [facility] [phone]  Case [id number]  Addendum: spoke with the ED regarding the CTA/P. Apparently her IV blew while trying to do the CTA, therefore it was not completed. They were unable to get peripheral access, and they therefore had to go ahead with a central line. Her seizures at this point in time appear to be explained by the left parasagittal mass; low suspicion for an LVO. However, I would recommend obtaining an MRI brain w/wo CST as well as MRA head/neck when able. She is to be  transferred for higher level of care to [facility], which may be happening shortly.

## 2020-04-25 NOTE — ED Notes (Addendum)
Pt husband at bedside reports that he noticed short term memory loss since Monday/tuesday. Pt got up approx 0800 normal. Took oxycodone 5 mg. Husband reports that she ate lunch and that while he was talking to her daughter he noticed her leaning to the right side. Also reports she had been shaking off and on all day. Husband assisted to a sitting position and she was unable to sit up and fell to right again. This was approx 1300, noted upper body to be shaking, mouth twisted to the right, drooling, eyes back in the head . When episode stopped pt was unresponsive.. EMS arrived and witnessed one seizure at the house and on en route.

## 2020-04-26 ENCOUNTER — Observation Stay (HOSPITAL_COMMUNITY): Payer: Medicare Other

## 2020-04-26 ENCOUNTER — Inpatient Hospital Stay (HOSPITAL_COMMUNITY): Payer: Medicare Other

## 2020-04-26 ENCOUNTER — Observation Stay (HOSPITAL_COMMUNITY)
Admit: 2020-04-26 | Discharge: 2020-04-26 | Disposition: A | Discharge status: Home / Self Care | Payer: Medicare Other | Attending: Internal Medicine | Admitting: Internal Medicine

## 2020-04-26 DIAGNOSIS — C7951 Secondary malignant neoplasm of bone: Secondary | ICD-10-CM | POA: Diagnosis present

## 2020-04-26 DIAGNOSIS — G4089 Other seizures: Secondary | ICD-10-CM | POA: Diagnosis not present

## 2020-04-26 DIAGNOSIS — C7931 Secondary malignant neoplasm of brain: Principal | ICD-10-CM

## 2020-04-26 DIAGNOSIS — G893 Neoplasm related pain (acute) (chronic): Secondary | ICD-10-CM | POA: Diagnosis present

## 2020-04-26 DIAGNOSIS — R64 Cachexia: Secondary | ICD-10-CM | POA: Diagnosis present

## 2020-04-26 DIAGNOSIS — G936 Cerebral edema: Secondary | ICD-10-CM | POA: Diagnosis present

## 2020-04-26 DIAGNOSIS — J9601 Acute respiratory failure with hypoxia: Secondary | ICD-10-CM | POA: Diagnosis present

## 2020-04-26 DIAGNOSIS — Z7189 Other specified counseling: Secondary | ICD-10-CM | POA: Diagnosis not present

## 2020-04-26 DIAGNOSIS — R569 Unspecified convulsions: Secondary | ICD-10-CM

## 2020-04-26 DIAGNOSIS — R531 Weakness: Secondary | ICD-10-CM

## 2020-04-26 DIAGNOSIS — R29711 NIHSS score 11: Secondary | ICD-10-CM | POA: Diagnosis present

## 2020-04-26 DIAGNOSIS — C7971 Secondary malignant neoplasm of right adrenal gland: Secondary | ICD-10-CM | POA: Diagnosis present

## 2020-04-26 DIAGNOSIS — I639 Cerebral infarction, unspecified: Secondary | ICD-10-CM | POA: Diagnosis present

## 2020-04-26 DIAGNOSIS — E162 Hypoglycemia, unspecified: Secondary | ICD-10-CM | POA: Diagnosis not present

## 2020-04-26 DIAGNOSIS — E876 Hypokalemia: Secondary | ICD-10-CM | POA: Diagnosis present

## 2020-04-26 DIAGNOSIS — C801 Malignant (primary) neoplasm, unspecified: Secondary | ICD-10-CM | POA: Diagnosis present

## 2020-04-26 DIAGNOSIS — Z515 Encounter for palliative care: Secondary | ICD-10-CM | POA: Diagnosis not present

## 2020-04-26 DIAGNOSIS — G9341 Metabolic encephalopathy: Secondary | ICD-10-CM | POA: Diagnosis present

## 2020-04-26 DIAGNOSIS — M84459A Pathological fracture, hip, unspecified, initial encounter for fracture: Secondary | ICD-10-CM | POA: Diagnosis present

## 2020-04-26 DIAGNOSIS — C7802 Secondary malignant neoplasm of left lung: Secondary | ICD-10-CM | POA: Diagnosis present

## 2020-04-26 DIAGNOSIS — Z681 Body mass index (BMI) 19 or less, adult: Secondary | ICD-10-CM | POA: Diagnosis not present

## 2020-04-26 DIAGNOSIS — Z20822 Contact with and (suspected) exposure to covid-19: Secondary | ICD-10-CM | POA: Diagnosis present

## 2020-04-26 DIAGNOSIS — G35 Multiple sclerosis: Secondary | ICD-10-CM | POA: Diagnosis present

## 2020-04-26 DIAGNOSIS — C787 Secondary malignant neoplasm of liver and intrahepatic bile duct: Secondary | ICD-10-CM | POA: Diagnosis present

## 2020-04-26 DIAGNOSIS — G40401 Other generalized epilepsy and epileptic syndromes, not intractable, with status epilepticus: Secondary | ICD-10-CM | POA: Diagnosis present

## 2020-04-26 DIAGNOSIS — E872 Acidosis: Secondary | ICD-10-CM | POA: Diagnosis present

## 2020-04-26 DIAGNOSIS — G40901 Epilepsy, unspecified, not intractable, with status epilepticus: Secondary | ICD-10-CM | POA: Diagnosis present

## 2020-04-26 DIAGNOSIS — Z66 Do not resuscitate: Secondary | ICD-10-CM | POA: Diagnosis not present

## 2020-04-26 DIAGNOSIS — R4701 Aphasia: Secondary | ICD-10-CM | POA: Diagnosis present

## 2020-04-26 LAB — COMPREHENSIVE METABOLIC PANEL
ALT: 10 U/L (ref 0–44)
AST: 14 U/L — ABNORMAL LOW (ref 15–41)
Albumin: 2.5 g/dL — ABNORMAL LOW (ref 3.5–5.0)
Alkaline Phosphatase: 137 U/L — ABNORMAL HIGH (ref 38–126)
Anion gap: 7 (ref 5–15)
BUN: 12 mg/dL (ref 8–23)
CO2: 23 mmol/L (ref 22–32)
Calcium: 8.2 mg/dL — ABNORMAL LOW (ref 8.9–10.3)
Chloride: 109 mmol/L (ref 98–111)
Creatinine, Ser: 0.39 mg/dL — ABNORMAL LOW (ref 0.44–1.00)
GFR calc Af Amer: >60 mL/min (ref >60)
GFR calc non Af Amer: >60 mL/min (ref >60)
Glucose, Bld: 95 mg/dL (ref 70–99)
Potassium: 3.1 mmol/L — ABNORMAL LOW (ref 3.5–5.1)
Sodium: 139 mmol/L (ref 135–145)
Total Bilirubin: 0.2 mg/dL — ABNORMAL LOW (ref 0.3–1.2)
Total Protein: 5.3 g/dL — ABNORMAL LOW (ref 6.5–8.1)

## 2020-04-26 LAB — BLOOD GAS, ARTERIAL
Acid-base deficit: 0.5 mmol/L (ref 0.0–2.0)
Bicarbonate: 24.5 mmol/L (ref 20.0–28.0)
Drawn by: 22223
FIO2: 40
MECHVT: 520 mL
O2 Saturation: 99.2 %
PEEP: 5 cmH2O
Patient temperature: 37
RATE: 14 resp/min
pCO2 arterial: 31.9 mmHg — ABNORMAL LOW (ref 32.0–48.0)
pH, Arterial: 7.468 — ABNORMAL HIGH (ref 7.350–7.450)
pO2, Arterial: 148 mmHg — ABNORMAL HIGH (ref 83.0–108.0)

## 2020-04-26 LAB — CBC
HCT: 30.4 % — ABNORMAL LOW (ref 36.0–46.0)
Hemoglobin: 9.6 g/dL — ABNORMAL LOW (ref 12.0–15.0)
MCH: 30.1 pg (ref 26.0–34.0)
MCHC: 31.6 g/dL (ref 30.0–36.0)
MCV: 95.3 fL (ref 80.0–100.0)
Platelets: 226 10*3/uL (ref 150–400)
RBC: 3.19 MIL/uL — ABNORMAL LOW (ref 3.87–5.11)
RDW: 16.7 % — ABNORMAL HIGH (ref 11.5–15.5)
WBC: 6.5 10*3/uL (ref 4.0–10.5)
nRBC: 0 % (ref 0.0–0.2)

## 2020-04-26 LAB — LACTIC ACID, PLASMA
Lactic Acid, Venous: 2.1 mmol/L (ref 0.5–1.9)
Lactic Acid, Venous: 1.3 mmol/L (ref 0.5–1.9)

## 2020-04-26 LAB — IRON AND TIBC
Iron: 32 ug/dL (ref 28–170)
Saturation Ratios: 10 % — ABNORMAL LOW (ref 10.4–31.8)
TIBC: 319 ug/dL (ref 250–450)
UIBC: 287 ug/dL

## 2020-04-26 LAB — CBG MONITORING, ED
Glucose-Capillary: 101 mg/dL — ABNORMAL HIGH (ref 70–99)
Glucose-Capillary: 70 mg/dL (ref 70–99)
Glucose-Capillary: 81 mg/dL (ref 70–99)
Glucose-Capillary: 70 mg/dL (ref 70–99)
Glucose-Capillary: 68 mg/dL — ABNORMAL LOW (ref 70–99)
Glucose-Capillary: 160 mg/dL — ABNORMAL HIGH (ref 70–99)

## 2020-04-26 LAB — SARS CORONAVIRUS 2 BY RT PCR (HOSPITAL ORDER, PERFORMED IN ~~LOC~~ HOSPITAL LAB): SARS Coronavirus 2: NEGATIVE

## 2020-04-26 LAB — RETICULOCYTES
Immature Retic Fract: 17.3 % — ABNORMAL HIGH (ref 2.3–15.9)
RBC.: 3.15 MIL/uL — ABNORMAL LOW (ref 3.87–5.11)
Retic Count, Absolute: 82.5 10*3/uL (ref 19.0–186.0)
Retic Ct Pct: 2.6 % (ref 0.4–3.1)

## 2020-04-26 LAB — FERRITIN: Ferritin: 108 ng/mL (ref 11–307)

## 2020-04-26 LAB — VITAMIN B12: Vitamin B-12: 698 pg/mL (ref 180–914)

## 2020-04-26 LAB — PROCALCITONIN: Procalcitonin: <0.1 ng/mL

## 2020-04-26 LAB — FOLATE: Folate: 35.8 ng/mL (ref >5.9)

## 2020-04-26 MED ORDER — LACOSAMIDE 200 MG/20ML IV SOLN
INTRAVENOUS | Status: AC
  Filled 2020-04-26: qty 20

## 2020-04-26 MED ORDER — ORAL CARE MOUTH RINSE
15.0000 mL | OROMUCOSAL | Status: DC
  Administered 2020-04-27 – 2020-04-28 (×18): 15 mL via OROMUCOSAL

## 2020-04-26 MED ORDER — SODIUM CHLORIDE 0.9 % IV SOLN
200.0000 mg | Freq: Two times a day (BID) | INTRAVENOUS | Status: DC
  Administered 2020-04-26 – 2020-04-30 (×8): 200 mg via INTRAVENOUS
  Filled 2020-04-26 (×14): qty 20

## 2020-04-26 MED ORDER — CHLORHEXIDINE GLUCONATE 0.12% ORAL RINSE (MEDLINE KIT)
15.0000 mL | Freq: Two times a day (BID) | OROMUCOSAL | Status: DC
  Administered 2020-04-27 – 2020-04-28 (×5): 15 mL via OROMUCOSAL

## 2020-04-26 MED ORDER — VANCOMYCIN HCL IN DEXTROSE 1-5 GM/200ML-% IV SOLN: 1000.0000 mg | INTRAVENOUS | Status: DC

## 2020-04-26 MED ORDER — ACETAMINOPHEN 650 MG RE SUPP
650.0000 mg | Freq: Once | RECTAL | Status: AC
  Administered 2020-04-26: 650 mg via RECTAL
  Filled 2020-04-26: qty 1

## 2020-04-26 MED ORDER — VANCOMYCIN HCL 1250 MG/250ML IV SOLN
1250.0000 mg | Freq: Once | INTRAVENOUS | Status: AC
  Administered 2020-04-26: 1250 mg via INTRAVENOUS
  Filled 2020-04-26: qty 250

## 2020-04-26 MED ORDER — DEXTROSE 5 % IV SOLN: Freq: Once | INTRAVENOUS | Status: AC

## 2020-04-26 MED ORDER — ACETAMINOPHEN 650 MG RE SUPP
650.0000 mg | Freq: Four times a day (QID) | RECTAL | Status: DC | PRN
  Administered 2020-04-26: 650 mg via RECTAL
  Filled 2020-04-26: qty 1

## 2020-04-26 MED ORDER — SODIUM CHLORIDE 0.9 % IV BOLUS
1000.0000 mL | Freq: Once | INTRAVENOUS | Status: AC
  Administered 2020-04-26: 1000 mL via INTRAVENOUS

## 2020-04-26 MED ORDER — DEXAMETHASONE SODIUM PHOSPHATE 4 MG/ML IJ SOLN
4.0000 mg | Freq: Four times a day (QID) | INTRAMUSCULAR | Status: DC
  Administered 2020-04-26 – 2020-04-30 (×18): 4 mg via INTRAVENOUS
  Filled 2020-04-26 (×18): qty 1

## 2020-04-26 MED ORDER — KCL IN DEXTROSE-NACL 40-5-0.45 MEQ/L-%-% IV SOLN
INTRAVENOUS | Status: DC
  Filled 2020-04-26 (×7): qty 1000

## 2020-04-26 MED ORDER — CHLORHEXIDINE GLUCONATE CLOTH 2 % EX PADS
6.0000 | MEDICATED_PAD | Freq: Every day | CUTANEOUS | Status: DC
  Administered 2020-04-26 – 2020-04-29 (×3): 6 via TOPICAL

## 2020-04-26 MED ORDER — SODIUM CHLORIDE 0.9 % IV SOLN
2.0000 g | Freq: Three times a day (TID) | INTRAVENOUS | Status: DC
  Administered 2020-04-26 – 2020-04-28 (×6): 2 g via INTRAVENOUS
  Filled 2020-04-26 (×7): qty 2

## 2020-04-26 MED ORDER — SODIUM CHLORIDE 0.9 % IV SOLN: Freq: Once | INTRAVENOUS | Status: AC

## 2020-04-26 NOTE — Progress Notes (Signed)
Pharmacy Antibiotic Note  Michele Sawyer is a 59 y.o. female admitted on 04/25/2020 with sepsis.  Pharmacy has been consulted for vancomycin and cefepime  dosing for temperature spike to 104.48F. Patient's allergy list includes penicillin;  reaction is listed as swelling and rash. Unable to ascertain whether or not patient has taken a cephalosporin in the past, as patient is intubated. Will monitor closely for allergic reaction.  Plan: Start cefepime 2g IV q8h Give vancomycin 1.25g  IV  x1 dose, then  vancomycin 1g  IV  q48 h Goal vancomycin trough range: 15-20 mcg/mL Pharmacy will continue to monitor renal function, vancomycin troughs as clinically appropriate,  cultures and patient progress.  Height: 5\' 7"  (170.2 cm) Weight: 54.4 kg (120 lb) IBW/kg (Calculated) : 61.6  Temp (24hrs), Avg:98.6 F (37 C), Min:95.5 F (35.3 C), Max:104.7 F (40.4 C)  Recent Labs  Lab 04/25/20 1422 04/25/20 1439 04/26/20 0548  WBC 5.7  --  6.5  CREATININE 0.40* 0.40* 0.39*    Estimated Creatinine Clearance: 63.4 mL/min (A) (by C-G formula based on SCr of 0.39 mg/dL (L)).    Allergies  Allergen Reactions  . Penicillins Swelling    Swell rash     Antimicrobials this admission: vancomycin 8/27>>   cefepime 8/27 >>    Microbiology results: 8/27 [facility] x2:    8/26 Resp PCR: SARS CoV-2 negative  8/27 MRSA PCR:    Thank you for allowing pharmacy to be a part of this patient's care.  Despina Pole 04/26/2020 4:34 PM

## 2020-04-26 NOTE — Progress Notes (Signed)
[place] Physician  I was asked to evaluate this patient as we have a critical shortage in ICU beds in our health system right now. This is an unfortunate lady who Came to the emergency room today and the setting of a seizure. She has required intubation sedation and mechanical ventilation. 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 It was initially felt that she needed to be sent to the [facility] emergency  department for long-term EEG monitoring. However, there are no ICU beds in the [facility] [place] currently and it is expected that should she arrive in the [facility] kind emergency room it is very likely that she would not receive adequate care based on their current staffing situation. And discussion with multiple providers who have seen in evaluate her this patient it was felt that the best decision to move her to the [facility] ICU wh ere she can receive ICU care faster.  Spot EEG will need to be performed there after arrival.  Roselie Awkward [phone]

## 2020-04-26 NOTE — ED Notes (Signed)
Pt given another rectal tylenol suppository due to pt having a BM after first one and pt not receiving the full dose

## 2020-04-26 NOTE — Progress Notes (Signed)
EEG completed, results pending. 

## 2020-04-26 NOTE — Consult Note (Addendum)
NAME:  Michele Sawyer, MRN:  [id number], DOB:  [birth year], LOS: 0 ADMISSION DATE:  04/25/2020, CONSULTATION DATE:  8/27 REFERRING MD:  [facility]/ Dr Manuella Ghazi, CHIEF COMPLAINT:  Acute resp failure/ status epilepticus    Brief History   69 yowf active smoker with metastatic breast cancer with new onset sz intubated in ER pm 8/26 and neuro consult by telemedicine rec ativan/ keptra and transfer to [facility] at [facility] though no be available.  History of present illness   59 y.o. female with history of MS and metastatic adenocarcinoma who is followed at [facility] who was brought in via EMS due to a seizure episode.  She was apparently sitting in her chair yesterday 8/26  Pm  at which point she began to have a grand mal seizure lasting about 2 minutes.  EMS was called and had noted a right-sided facial droop.  She had 2 more witnessed seizures with EMS lasting 1 minute each.  Her body was apparently tensing and gazing to the right according to documentation.  She was unresponsive on arrival to ED.  She was subsequently intubated for airway protection and had received a total of 7 mg of IV Ativan and was given Keppra 2 g dose and continued    Past Medical History  Met adencoca  ? Primary  f/b  [place] [facility]     Consults:  Neurology 8/26  PCCM 8/27  Procedures:  Oral et 8/26 >>>  Significant Diagnostic Tests:   CT head code stroke 8/26  1 cm lesion of the parasagittal left parietal lobe with surrounding edema and minor mass effect. Most consistent with a metastasis. Contrast enhanced MRI is recommended for further evaluation.  Micro Data:  Covid 19  8/26 neg BC x 2  8/19   Antimicrobials:      Scheduled Meds:  dexamethasone (DECADRON) injection  4 mg Intravenous Q6H   Continuous Infusions:  dextrose 5 % and 0.45 % NaCl with KCl 40 mEq/L     levETIRAcetam Stopped (04/26/20 0631)   propofol (DIPRIVAN) infusion 45 mcg/kg/min (04/26/20 0130)   PRN  Meds:.acetaminophen, iohexol  Interim history/subjective:  Sedated on vent  Objective   Blood pressure (!) 158/73, pulse (!) 130, temperature (!) 102.9 F (39.4 C), resp. rate (!) 25, height _0  (1.702 m), weight 54.4 kg, SpO2 98 %.    Vent Mode: PRVC FiO2 (%):  [40 %-100 %] 40 % Set Rate:  [14 bmp] 14 bmp Vt Set:  [500 mL-520 mL] 520 mL PEEP:  [5 cmH20] 5 cmH20 Plateau Pressure:  [15 cmH20-26 cmH20] 26 cmH20   Intake/Output Summary (Last 24 hours) at 04/26/2020 1427 Last data filed at 04/26/2020 1358 Gross per 24 hour  Intake 319.57 ml  Output 440 ml  Net -120.43 ml   Filed Weights   04/25/20 1423  Weight: 54.4 kg    Examination: Tmax 102.9  General: sedated on vent HENT: oral et  Lungs: distant bs  Cardiovascular: RRR no s3  Abdomen: soft/ benight Extremities: warm s cyanosis or clubbing  Neuro: sedated on vent, no more twitching/ no nystagmus    I personally reviewed images and agree with radiology impression as follows:  CXR:  8/27 port Tube and catheter positions as described without pneumothorax. Apparent mass in the left upper lobe is not well seen on this study, in part due to overlapping of monitor lead. No edema or airspace opacity. Stable cardiac silhouette.   I personally reviewed  images and agree with radiology impression as follows:   Chest CT PET 04/20/2020 1. Moderate progression of widespread metastasis as detailed above. New and increased hypermetabolic lung lesions, a new focus of right hepatic lobe hypermetabolism (suspicious for metastasis) and enlargement of a right adrenal metastasis. 2. New and progressive subcutaneous and intramuscular lesions as detailed above. 3. Right acetabular and proximal femoral metastasis, with new cortical involvement and pathologic fracture involving the weight-bearing surface of the right acetabulum.  Resolved Hospital Problem list     Assessment & Plan:    1) status epilepticus in pt with newly dx  brain met? primary  - see neurol televisit 8/16  rx as outlined > hope to transfer to [facility] for cont EEG monitor  2) Acute respiratory failure secondary to 1, comfortable to present vent settings plus sedations   3) Met ca ? Origin  with widespread met as per PET 8/21/1 f/ b [facility]   >>> priority is to control sz  so leave on vent for now   4) mild anemia ? 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 trx to [place] ?      Labs   CBC: Recent Labs  Lab 04/25/20 1422 04/25/20 1439 04/26/20 0548  WBC 5.7  --  6.5  NEUTROABS 4.7  --   --   HGB 10.4* 11.2* 9.6*  HCT 32.6* 33.0* 30.4*  MCV 96.7  --  95.3  PLT 227  --  856    Basic Metabolic Panel: Recent Labs  Lab 04/25/20 1422 04/25/20 1439 04/26/20 0548  NA 138 142 139  K 3.0* 3.6 3.1*  CL 104 101 109  CO2 24  --  23  GLUCOSE 107* 101* 95  BUN _0 CREATININE 0.40* 0.40* 0.39*  CALCIUM 8.2*  --  8.2*   GFR: Estimated Creatinine Clearance: 63.4 mL/min (A) (by C-G formula based on SCr of 0.39 mg/dL (L)). Recent Labs  Lab 04/25/20 1422 04/26/20 0548  WBC 5.7 6.5    Liver Function Tests: Recent Labs  Lab 04/25/20 1422 04/26/20 0548  AST 16 14*  ALT 10 10  ALKPHOS 153* 137*  BILITOT 0.5 0.2*  PROT 5.8* 5.3*  ALBUMIN 2.8* 2.5*   No results for input(s): LIPASE, AMYLASE in the last 168 hours. No results for input(s): AMMONIA in the last 168 hours.  ABG    Component Value Date/Time   TCO2 30 04/25/2020 1439      Coagulation Profile: Recent Labs  Lab 04/25/20 1422  INR 1.0    Cardiac Enzymes: No results for input(s): CKTOTAL, CKMB, CKMBINDEX, TROPONINI in the last 168 hours.  HbA1C: No results found for: HGBA1C  CBG: Recent Labs  Lab 04/26/20 0230 04/26/20 0839 04/26/20 1028 04/26/20 1113 04/26/20 1201  GLUCAP 101* 70 81 70 68*       Past Medical History  She,  has a past medical history of Cancer ([place]), Liver disease, Multiple sclerosis ([place]),  and Vision abnormalities.      Social History   reports that she has been smoking cigarettes. She has never used smokeless tobacco. She reports that she does not drink alcohol and does not use drugs.   Family History   Her family history includes Bone cancer in her father; Diabetes in her mother.   Allergies Allergies  Allergen Reactions   Penicillins Swelling    Swell rash     Home Medications  Prior to Admission medications   Medication Sig Start Date End Date Taking? Authorizing Provider  baclofen (LIORESAL) 10 MG tablet Take 10 mg by mouth 2 (two) times daily.  03/20/16  Yes [provider]  calcium citrate-vitamin D (CITRACAL+D) 315-200 MG-UNIT tablet Take 1 tablet by mouth 2 (two) times daily.   Yes [provider]  Cholecalciferol (VITAMIN D3) 3000 units TABS Take 2,000 Units by mouth daily.  08/17/13  Yes [provider]  Fingolimod HCl (GILENYA) 0.5 MG CAPS Take 0.5 mg by mouth daily.  01/08/17  Yes [provider]  ibuprofen (ADVIL) 400 MG tablet Take 400 mg by mouth every 4 (four) hours as needed for moderate pain.   Yes [provider]  oxyCODONE (OXY IR/ROXICODONE) 5 MG immediate release tablet Take 5 mg by mouth every 4 (four) hours as needed for severe pain.  03/21/20  Yes [provider]  potassium chloride (KLOR-CON) 10 MEQ tablet Take 20 mEq by mouth daily. 04/18/20  Yes [provider]  venlafaxine XR (EFFEXOR-XR) 37.5 MG 24 hr capsule Take 75 mg by  mouth daily with breakfast.  03/20/16  Yes [provider]  phentermine 30 MG capsule Take 1 capsule (30 mg total) by mouth every morning. Patient not taking: Reported on 04/26/2020 02/05/17   Britt Bottom, MD      The patient is critically ill with multiple organ systems failure and requires high complexity decision making for assessment and support, frequent evaluation and titration of therapies, application of advanced monitoring technologies and extensive interpretation of multiple databases. Critical Care Time devoted to patient care services described in this note is 45 minutes.   Christinia Gully, MD Pulmonary and [place] [phone]   After 7:00 pm call Elink  [phone]

## 2020-04-26 NOTE — Plan of Care (Addendum)
[facility] inpatient neurology team was notified of this patient in the daytime yesterday and she has since been waiting in the [facility] emergency room for transfer to an ICU bed at [facility] and the entire system currently is experiencing critical ICU bed shortage. Dr. Lake Bells and team from critical care has decided to transfer the patient over to [facility], where an ICU bed is more likely to open up due to critical shortage of beds systemwide, when compared to [facility]. I called the on-call PCCM Dr. Lucile Shutters to discuss the transfer-the reason for transferring this patient to [facility] is to get LTM EEG monitoring, which is not available at [facility].  So the transfer to [facility] to [place] [facility] will basically defeat the purpose for long-term monitoring and treatment based on EEG results. Dr. Lucile Shutters also brought up the idea of trying to send her to [facility] as she is followed there for her cancer and the current brain lesion is likely a metastases, which should be pursued so that the patient is not left in the emergency room for many more hours. We would be happy to follow her at [facility] or [place] [facility] but we would not be able to provide LTM EEG or for that matter routine EEG over the weekend due to staffing issues at [facility]. I relayed my concerns to Dr. Lucile Shutters and he is aware.  He will discuss with the ED doctors at [facility].  -- Amie Portland, MD [facility] Pager: [phone] If 7pm to 7am, please call on call as listed on AMION.   ADDENDUM Communicated with Dr. Lake Bells, who had seen the pt this evening. He is concerned that beds will not be available at [facility] campus and [facility] transfer might be the best for the patient.  He also said that beds are not available at [facility]. We will see her when she arrives at [facility] ICU. We will try to get a routine EEG tomorrow if possible, but made Dr Lake Bells aware that LTM will not be possible at  [facility]. Will follow along once patient arrives at [facility] ICU. Please call neurology upon pt arrival. -- Amie Portland, MD [facility] Pager: [phone] If 7pm to 7am, please call on call as listed on AMION.

## 2020-04-26 NOTE — Consult Note (Addendum)
Medical Consultation   Michele Sawyer  JKD:[id number]  DOB: [birth year]  DOA: 04/25/2020  PCP: System, Provider Not In    Requesting physician: Dr. Tacy Learn PCCM  Reason for consultation: Status epilepticus status post intubation for airway protection   History of Present Illness: Michele Sawyer is an 59 y.o. female with history of MS and metastatic adenocarcinoma who is followed at [facility] health who was brought in via EMS due to a seizure episode.  She was apparently sitting in her chair yesterday afternoon at which point she began to have a grand mal seizure lasting about 2 minutes.  EMS was called and had noted a right-sided facial droop.  She had 2 more witnessed seizures with EMS lasting 1 minute each.  Her body was apparently tensing and gazing to the right according to documentation.  She was unresponsive on arrival to ED.  She was subsequently intubated for airway protection and had received a total of 7 mg of IV Ativan and was given Keppra 2 g dose.  She remains on Keppra at this time and continues to have some intermittent hypoglycemia.  She appears comfortable on the ventilator with propofol for sedation.  Patient has been accepted by PCCM for further evaluation and management, but is still awaiting transfer.  Plan to monitor while in the ED and consult with neurology as well as pulmonology.  Review of Systems:  ROS Cannot be obtained given current patient condition.   Past Medical History: Past Medical History:  Diagnosis Date  . Cancer [facility])    metastatic adenocarcinoma  . Liver disease   . Multiple sclerosis ([place])   . Vision abnormalities     Past Surgical History: Unknown  Allergies:   Allergies  Allergen Reactions  . Penicillins Swelling    Swell rash     Social History:  reports that she has been smoking cigarettes. She has never used smokeless tobacco. She reports that she does not drink alcohol and does not use drugs.   Family  History: Family History  Problem Relation Age of Onset  . Diabetes Mother   . Bone cancer Father     Physical Exam: Vitals:   04/26/20 1045 04/26/20 1200 04/26/20 1234 04/26/20 1300  BP: 124/61 (!) 145/66  (!) 146/76  Pulse: 96 (!) 111  (!) 121  Resp: 18 19  (!) 25  Temp: 99.1 F (37.3 C) 100.2 F (37.9 C)  (!) 101.5 F (38.6 C)  TempSrc:      SpO2: 99% 99% 98% 97%  Weight:      Height:        Constitutional: Intubated, sedated on ventilator Neck: neck appears normal CVS: S1-S2 clear, no murmur rubs or gallops, no LE edema, normal pedal pulses  Respiratory:  clear to auscultation bilaterally, no wheezing, rales or rhonchi. Respiratory effort normal. No accessory muscle use.  Abdomen: soft nontender, nondistended, normal bowel sounds Musculoskeletal: : no cyanosis, clubbing or edema noted bilaterally Psych: Difficult to assess Skin: no rashes or lesions or ulcers, no induration  Data reviewed:  I have personally reviewed following labs and imaging studies Labs:  CBC: Recent Labs  Lab 04/25/20 1422 04/25/20 1439 04/26/20 0548  WBC 5.7  --  6.5  NEUTROABS 4.7  --   --   HGB 10.4* 11.2* 9.6*  HCT 32.6* 33.0* 30.4*  MCV 96.7  --  95.3  PLT 227  --  400    Basic Metabolic Panel: Recent Labs  Lab 04/25/20 1422 04/25/20 1422 04/25/20 1439 04/26/20 0548  NA 138  --  142 139  K 3.0*   < > 3.6 3.1*  CL 104  --  101 109  CO2 24  --   --  23  GLUCOSE 107*  --  101* 95  BUN 12  --  12 12  CREATININE 0.40*  --  0.40* 0.39*  CALCIUM 8.2*  --   --  8.2*   < > = values in this interval not displayed.   GFR Estimated Creatinine Clearance: 63.4 mL/min (A) (by C-G formula based on SCr of 0.39 mg/dL (L)). Liver Function Tests: Recent Labs  Lab 04/25/20 1422 04/26/20 0548  AST 16 14*  ALT 10 10  ALKPHOS 153* 137*  BILITOT 0.5 0.2*  PROT 5.8* 5.3*  ALBUMIN 2.8* 2.5*   No results for input(s): LIPASE, AMYLASE in the last 168 hours. No results for input(s):  AMMONIA in the last 168 hours. Coagulation profile Recent Labs  Lab 04/25/20 1422  INR 1.0    Cardiac Enzymes: No results for input(s): CKTOTAL, CKMB, CKMBINDEX, TROPONINI in the last 168 hours. BNP: Invalid input(s): POCBNP CBG: Recent Labs  Lab 04/26/20 0230 04/26/20 0839 04/26/20 1028 04/26/20 1113 04/26/20 1201  GLUCAP 101* 70 81 70 68*   D-Dimer No results for input(s): DDIMER in the last 72 hours. Hgb A1c No results for input(s): HGBA1C in the last 72 hours. Lipid Profile No results for input(s): CHOL, HDL, LDLCALC, TRIG, CHOLHDL, LDLDIRECT in the last 72 hours. Thyroid function studies No results for input(s): TSH, T4TOTAL, T3FREE, THYROIDAB in the last 72 hours.  Invalid input(s): FREET3 Anemia work up No results for input(s): VITAMINB12, FOLATE, FERRITIN, TIBC, IRON, RETICCTPCT in the last 72 hours. Urinalysis    Component Value Date/Time   COLORURINE YELLOW 04/25/2020 [place] 04/25/2020 1422   LABSPEC 1.016 04/25/2020 1422   PHURINE 7.0 04/25/2020 1422   GLUCOSEU NEGATIVE 04/25/2020 1422   HGBUR LARGE (A) 04/25/2020 1422   BILIRUBINUR NEGATIVE 04/25/2020 1422   KETONESUR NEGATIVE 04/25/2020 1422   PROTEINUR NEGATIVE 04/25/2020 1422   NITRITE NEGATIVE 04/25/2020 1422   LEUKOCYTESUR TRACE (A) 04/25/2020 1422     Microbiology Recent Results (from the past 240 hour(s))  SARS Coronavirus 2 by RT PCR (hospital order, performed in [place] hospital lab) Nasopharyngeal Nasopharyngeal Swab     Status: None   Collection Time: 04/25/20  2:24 PM   Specimen: Nasopharyngeal Swab  Result Value Ref Range Status   SARS Coronavirus 2 NEGATIVE NEGATIVE Final    Comment: (NOTE) SARS-CoV-2 target nucleic acids are NOT DETECTED.  The SARS-CoV-2 RNA is generally detectable in upper and lower respiratory specimens during the acute phase of infection. The lowest concentration of SARS-CoV-2 viral copies this assay can detect is 250 copies / mL. A  negative result does not preclude SARS-CoV-2 infection and should not be used as the sole basis for treatment or other patient management decisions.  A negative result may occur with improper specimen collection / handling, submission of specimen other than nasopharyngeal swab, presence of viral mutation(s) within the areas targeted by this assay, and inadequate number of viral copies (<250 copies / mL). A negative result must be combined with clinical observations, patient history, and epidemiological information.  Fact Sheet for Patients:   StrictlyIdeas.no  Fact Sheet for Healthcare Providers: BankingDealers.co.za  This test is not yet approved or  cleared by the [place] and has been authorized for detection and/or diagnosis of SARS-CoV-2 by FDA under an Emergency Use Authorization (EUA).  This EUA will remain in effect (meaning this test can be used) for the duration of the COVID-19 declaration under Section 564(b)(1) of the Act, 21 U.S.C. section 360bbb-3(b)(1), unless the authorization is terminated or revoked sooner.  Performed at [facility], [address], [place], [place] [address]        Inpatient Medications:   Scheduled Meds: Continuous Infusions: . levETIRAcetam Stopped (04/26/20 0634)  . propofol (DIPRIVAN) infusion 45 mcg/kg/min (04/26/20 0130)     Radiological Exams on Admission: DG Chest Portable 1 View  Result Date: 04/25/2020 CLINICAL DATA:  Endotracheal and OG tube placement. EXAM: PORTABLE CHEST 1 VIEW COMPARISON:  PET CT 04/17/2020 FINDINGS: Endotracheal tube tip 2.3 cm from the carina. No orogastric tube is visualized. There is a right chest port with tip in the SVC. Heart is normal in size with normal mediastinal contours. Nodular opacity in the left upper lobe corresponds to nodule on PET. Central right infrahilar nodule on PET not well seen by radiograph. There is no pleural effusion or  pneumothorax. Normal pulmonary vasculature. No acute osseous abnormalities are seen. IMPRESSION: 1. Endotracheal tube tip 2.3 cm from the carina. 2. No enteric tube is visualized. 3. Left upper lobe nodule corresponds to nodule on PET. Known right infrahilar nodule not well seen by radiograph Electronically Signed   By: Keith Rake M.D.   On: 04/25/2020 17:19   CT HEAD CODE STROKE WO CONTRAST  Result Date: 04/25/2020 CLINICAL DATA:  Code stroke. Right facial droop, suspected new seizure, history of MS and metastatic adenocarcinoma of unknown primary EXAM: CT HEAD WITHOUT CONTRAST TECHNIQUE: Contiguous axial images were obtained from the base of the skull through the vertex without intravenous contrast. COMPARISON:  Correlation made with prior MRI from 2020 FINDINGS: Motion artifact is present despite repeat scanning. Brain: There is a mildly hyperdense lesion of the parasagittal left parietal lobe near the calcarine sulcus measuring 1 cm. Surrounding hypoattenuation in the white matter of the parietal lobe likely reflects associated vasogenic edema. Mass effect is minor. No acute intracranial hemorrhage. Gray-white differentiation appears preserved within above limitation. Prominence of the ventricles and sulci reflects generalized parenchymal volume loss. Patchy and confluent areas of hypoattenuation in the supratentorial white matter nonspecific but probably reflect chronic demyelination and microvascular ischemic changes. Vascular: No hyperdense vessel. Skull: Unremarkable. Sinuses/Orbits: No acute abnormality. Other: Mastoid air cells are clear. IMPRESSION: Motion degraded. 1 cm lesion of the parasagittal left parietal lobe with surrounding edema and minor mass effect. Most consistent with a metastasis. Contrast enhanced MRI is recommended for further evaluation. These results were called by telephone at the time of interpretation on 04/25/2020 at 2:55 pm to provider [facility] , who verbally  acknowledged these results. Electronically Signed   By: Macy Mis M.D.   On: 04/25/2020 15:00    Impression/Recommendations Active Problems:   Status epilepticus ([place])   Status epilepticus-new onset -Appears to be related to metastatic adenocarcinoma with 1 cm left parietal lobe lesion noted on CT head -Further imaging ordered and pending per neurology -Continue on dexamethasone 4 mg every 6 hours as recommended after 10 mg loading dose -Continue on Keppra 1000 twice daily as currently ordered -Continue propofol for sedation while on ventilator -Appreciate neurology evaluation as requested-I will order EEG -Appreciate pulmonology evaluation as requested while on ventilator -SCDs for DVT prophylaxis for now  Fever -  Blood cultures x 2 will be obtained -CXR -UA without sign of UTI on 8/26 -Procalcitonin will be evaluated and we will consider antibiotics as needed -Tylenol ordered as needed -Plan to remove femoral line and obtain new IV access; discussed with RN  Persistent hypoglycemia -Plan to maintain on D5 infusion while intubated on ventilator and continue monitoring  Mild hypokalemia -Replete IV and recheck in a.m. along with magnesium  Anemia -No overt bleeding identified -Check anemia panel  Noted history of MS -Patient has seen Dr. Felecia Shelling in the past  Noted history of metastatic adenocarcinoma -Follows at [facility] health  Thank you for this consultation.  Our [facility] hospitalist team will follow the patient with you.  Discussed with husband on phone 8/27.   Time Spent: 60 minutes  Rodena Goldmann M.D. [facility] 04/26/2020, 1:19 PM      con

## 2020-04-26 NOTE — ED Notes (Signed)
Dr. Manuella Ghazi made aware of pt's fever and elevated HR.  New orders received.

## 2020-04-26 NOTE — ED Notes (Signed)
[facility] called and received report and en route for transportation.

## 2020-04-26 NOTE — ED Provider Notes (Signed)
Dr. Lake Bells call me and stated he spoke to Dr. Melvyn Novas and they have decided to admit the patient to the ICU over at [facility] instead of sending her to the ED.  It was decided she did not need continuous EEG.  I was told by Dr. Lake Bells that the patient would get a ICU bed at [facility] this evening which is Friday the 27th.   Milton Ferguson, MD 04/26/20 2017

## 2020-04-26 NOTE — Consult Note (Signed)
Michele A. Merlene Laughter, MD     [email or web address]          Michele Sawyer is an 59 y.o. female.   ASSESSMENT/PLAN: 1. 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  The patient is on propofol and Keppra reduced she seems to be having some epileptiform discharges on EEG with some clinical findings suggestive of ongoing seizures. Vimpat will therefore be initiated. Continue with propofol and Keppra. She is being transferred ultimately to [facility] for a long-term EEG monitoring. 2. Multifactorial encephalopathy including status epilepticus and medication effect. 3. Multiple sclerosis    This is a 59 year female presents about 3 consecutive generalized seizures. She was given IV Ativan about 7 mg and 2 g. She was subsequently intubated. Imaging shows hypodense left occipital lesion with surrrunding edema. She has been placed on propofol and remains stuporous.   GENERAL:  She is intubated and sedated. The exam is done under propofol sedation.  HEENT:  Neck is supple no trauma noted.  ABDOMEN: soft  EXTREMITIES: No edema   BACK: Norma  SKIN: Normal by inspection.    MENTAL STATUS:  There is partial eye opening to deep painful stimuli on the left. No like opening on the right. She does grimace to pain times. She does not follow commands.  CRANIAL NERVES:  Pupils midposition and briskly reactive. Corneal reflexes are diminished on the right but fine on the left. Oculocephalic reflexes are preserved. Facial muscle strength is symmetric.  MOTOR:  There appears to be increased tone bilaterally with some evidence suggesting decerebral posturing on the left.  There is minimal response to painful stimuli on the right.  COORDINATION: No tremors, dysmetria myoclonus are noted.  SENSATION:  Grimaces sometimes to painful stimuli both sides.       Blood pressure (!) 132/48, pulse (!) 110, temperature (!) 100.8 F (38.2 C), resp. rate 19, height 5\' 7"   (1.702 m), weight 54.4 kg, SpO2 100 %.  Past Medical History:  Diagnosis Date  . Cancer [name])    metastatic adenocarcinoma  . Liver disease   . Multiple sclerosis ([name])   . Vision abnormalities       Family History  Problem Relation Age of Onset  . Diabetes Mother   . Bone cancer Father     Social History:  reports that she has been smoking cigarettes. She has never used smokeless tobacco. She reports that she does not drink alcohol and does not use drugs.  Allergies:  Allergies  Allergen Reactions  . Penicillins Swelling    Swell rash    Medications: Prior to Admission medications   Medication Sig Start Date End Date Taking? Authorizing Provider  baclofen (LIORESAL) 10 MG tablet Take 10 mg by mouth 2 (two) times daily.  03/20/16  Yes [provider]  calcium citrate-vitamin D (CITRACAL+D) 315-200 MG-UNIT tablet Take 1 tablet by mouth 2 (two) times daily.   Yes [provider]  Cholecalciferol (VITAMIN D3) 3000 units TABS Take 2,000 Units by mouth daily.  08/17/13  Yes [provider]  Fingolimod HCl (GILENYA) 0.5 MG CAPS Take 0.5 mg by mouth daily.  01/08/17  Yes [provider]  ibuprofen (ADVIL) 400 MG tablet Take 400 mg by mouth every 4 (four) hours as needed for moderate pain.   Yes [provider]  oxyCODONE (OXY IR/ROXICODONE) 5 MG immediate release tablet Take 5 mg by mouth every 4 (four) hours as needed for severe pain.  03/21/20  Yes [provider]  potassium chloride (KLOR-CON) 10 MEQ tablet Take 20 mEq by mouth daily. 04/18/20  Yes [provider]  venlafaxine XR (EFFEXOR-XR) 37.5 MG 24 hr capsule Take 75 mg by mouth daily with breakfast.  03/20/16  Yes [provider]  phentermine 30 MG capsule Take 1 capsule (30 mg total) by mouth every morning. Patient not taking: Reported on 04/26/2020 02/05/17   Britt Bottom, MD    Scheduled Meds: . dexamethasone (DECADRON) injection  4 mg Intravenous  Q6H   Continuous Infusions: . ceFEPime (MAXIPIME) IV 2 g (04/26/20 1645)  . dextrose 5 % and 0.45 % NaCl with KCl 40 mEq/L 75 mL/hr at 04/26/20 1541  . lacosamide (VIMPAT) IV    . levETIRAcetam 1,000 mg (04/26/20 1841)  . propofol (DIPRIVAN) infusion 45 mcg/kg/min (04/26/20 1527)  . [START ON 04/27/2020] vancomycin    . vancomycin 1,250 mg (04/26/20 1731)   PRN Meds:.acetaminophen, iohexol     Results for orders placed or performed during the hospital encounter of 04/25/20 (from the past 48 hour(s))  CBG monitoring, ED     Status: None   Collection Time: 04/25/20  2:17 PM  Result Value Ref Range   Glucose-Capillary 90 70 - 99 mg/dL    Comment: Glucose reference range applies only to samples taken after fasting for at least 8 hours.  Ethanol     Status: None   Collection Time: 04/25/20  2:22 PM  Result Value Ref Range   Alcohol, Ethyl (B) <10 <10 mg/dL    Comment: (NOTE) Lowest detectable limit for serum alcohol is 10 mg/dL.  For medical purposes only. Performed at [facility], [address] [place], [place] [address]   Protime-INR     Status: None   Collection Time: 04/25/20  2:22 PM  Result Value Ref Range   Prothrombin Time 12.6 11.4 - 15.2 seconds   INR 1.0 0.8 - 1.2    Comment: (NOTE) INR goal varies based on device and disease states. Performed at [facility], [address], [place], [place] [address]   APTT     Status: None   Collection Time: 04/25/20  2:22 PM  Result Value Ref Range   aPTT 25 24 - 36 seconds    Comment: Performed at [facility], [address], [place], [place] [address]  CBC     Status: Abnormal   Collection Time: 04/25/20  2:22 PM  Result Value Ref Range   WBC 5.7 4.0 - 10.5 K/uL   RBC 3.37 (L) 3.87 - 5.11 MIL/uL   Hemoglobin 10.4 (L) 12.0 - 15.0 g/dL   HCT 32.6 (L) 36 - 46 %   MCV 96.7 80.0 - 100.0 fL   MCH 30.9 26.0 - 34.0 pg   MCHC 31.9 30.0 - 36.0 g/dL   RDW 17.0 (H) 11.5 - 15.5 %   Platelets 227 150 - 400 K/uL   nRBC 0.0 0.0 -  0.2 %    Comment: Performed at [facility], [address], [place], [place] [address]  Differential     Status: Abnormal   Collection Time: 04/25/20  2:22 PM  Result Value Ref Range   Neutrophils Relative % 83 %   Neutro Abs 4.7 1.7 - 7.7 K/uL   Lymphocytes Relative 6 %   Lymphs Abs 0.3 (L) 0.7 - 4.0 K/uL   Monocytes Relative 9 %   Monocytes Absolute 0.5 0 - 1 K/uL   Eosinophils Relative 1 %   Eosinophils Absolute 0.1 0 - 0 K/uL  Basophils Relative 0 %   Basophils Absolute 0.0 0 - 0 K/uL   Immature Granulocytes 1 %   Abs Immature Granulocytes 0.08 (H) 0.00 - 0.07 K/uL    Comment: Performed at [facility], [address], [place], [place] [address]  Comprehensive metabolic panel     Status: Abnormal   Collection Time: 04/25/20  2:22 PM  Result Value Ref Range   Sodium 138 135 - 145 mmol/L   Potassium 3.0 (L) 3.5 - 5.1 mmol/L   Chloride 104 98 - 111 mmol/L   CO2 24 22 - 32 mmol/L   Glucose, Bld 107 (H) 70 - 99 mg/dL    Comment: Glucose reference range applies only to samples taken after fasting for at least 8 hours.   BUN 12 8 - 23 mg/dL   Creatinine, Ser 0.40 (L) 0.44 - 1.00 mg/dL   Calcium 8.2 (L) 8.9 - 10.3 mg/dL   Total Protein 5.8 (L) 6.5 - 8.1 g/dL   Albumin 2.8 (L) 3.5 - 5.0 g/dL   AST 16 15 - 41 U/L   ALT 10 0 - 44 U/L   Alkaline Phosphatase 153 (H) 38 - 126 U/L   Total Bilirubin 0.5 0.3 - 1.2 mg/dL   GFR calc non Af Amer >60 >60 mL/min   GFR calc Af Amer >60 >60 mL/min   Anion gap 10 5 - 15    Comment: Performed at [facility], [address] [place], [place] [address]  Urine rapid drug screen (hosp performed)     Status: Abnormal   Collection Time: 04/25/20  2:22 PM  Result Value Ref Range   Opiates NONE DETECTED NONE DETECTED   Cocaine NONE DETECTED NONE DETECTED   Benzodiazepines POSITIVE (A) NONE DETECTED   Amphetamines NONE DETECTED NONE DETECTED   Tetrahydrocannabinol NONE DETECTED NONE DETECTED   Barbiturates NONE DETECTED NONE DETECTED    Comment:  (NOTE) DRUG SCREEN FOR MEDICAL PURPOSES ONLY.  IF CONFIRMATION IS NEEDED FOR ANY PURPOSE, NOTIFY LAB WITHIN 5 DAYS.  LOWEST DETECTABLE LIMITS FOR URINE DRUG SCREEN Drug Class                     Cutoff (ng/mL) Amphetamine and metabolites    1000 Barbiturate and metabolites    200 Benzodiazepine                 240 Tricyclics and metabolites     300 Opiates and metabolites        300 Cocaine and metabolites        300 THC                            50 Performed at [facility], [address], [place], [place] [address]   Urinalysis, Routine w reflex microscopic Urine, Catheterized     Status: Abnormal   Collection Time: 04/25/20  2:22 PM  Result Value Ref Range   Color, Urine YELLOW YELLOW   APPearance CLEAR CLEAR   Specific Gravity, Urine 1.016 1.005 - 1.030   pH 7.0 5.0 - 8.0   Glucose, UA NEGATIVE NEGATIVE mg/dL   Hgb urine dipstick LARGE (A) NEGATIVE   Bilirubin Urine NEGATIVE NEGATIVE   Ketones, ur NEGATIVE NEGATIVE mg/dL   Protein, ur NEGATIVE NEGATIVE mg/dL   Nitrite NEGATIVE NEGATIVE   Leukocytes,Ua TRACE (A) NEGATIVE   RBC / HPF >50 (H) 0 - 5 RBC/hpf   WBC, UA 21-50 0 - 5  WBC/hpf   Bacteria, UA RARE (A) NONE SEEN   Mucus PRESENT     Comment: Performed at [facility], [address] [place], [place] [address]  SARS Coronavirus 2 by RT PCR (hospital order, performed in [facility] hospital lab) Nasopharyngeal Nasopharyngeal Swab     Status: None   Collection Time: 04/25/20  2:24 PM   Specimen: Nasopharyngeal Swab  Result Value Ref Range   SARS Coronavirus 2 NEGATIVE NEGATIVE    Comment: (NOTE) SARS-CoV-2 target nucleic acids are NOT DETECTED.  The SARS-CoV-2 RNA is generally detectable in upper and lower respiratory specimens during the acute phase of infection. The lowest concentration of SARS-CoV-2 viral copies this assay can detect is 250 copies / mL. A negative result does not preclude SARS-CoV-2 infection and should not be used as the sole basis for  treatment or other patient management decisions.  A negative result may occur with improper specimen collection / handling, submission of specimen other than nasopharyngeal swab, presence of viral mutation(s) within the areas targeted by this assay, and inadequate number of viral copies (<250 copies / mL). A negative result must be combined with clinical observations, patient history, and epidemiological information.  Fact Sheet for Patients:   StrictlyIdeas.no  Fact Sheet for Healthcare Providers: BankingDealers.co.za  This test is not yet approved or  cleared by the [place] FDA and has been authorized for detection and/or diagnosis of SARS-CoV-2 by FDA under an Emergency Use Authorization (EUA).  This EUA will remain in effect (meaning this test can be used) for the duration of the COVID-19 declaration under Section 564(b)(1) of the Act, 21 U.S.C. section 360bbb-3(b)(1), unless the authorization is terminated or revoked sooner.  Performed at [facility], [address], [place], [place] [address]   Ginger Carne 8, ED     Status: Abnormal   Collection Time: 04/25/20  2:39 PM  Result Value Ref Range   Sodium 142 135 - 145 mmol/L   Potassium 3.6 3.5 - 5.1 mmol/L   Chloride 101 98 - 111 mmol/L   BUN 12 8 - 23 mg/dL   Creatinine, Ser 0.40 (L) 0.44 - 1.00 mg/dL   Glucose, Bld 101 (H) 70 - 99 mg/dL    Comment: Glucose reference range applies only to samples taken after fasting for at least 8 hours.   Calcium, Ion 1.23 1.15 - 1.40 mmol/L   TCO2 30 22 - 32 mmol/L   Hemoglobin 11.2 (L) 12.0 - 15.0 g/dL   HCT 33.0 (L) 36 - 46 %  CBG monitoring, ED     Status: Abnormal   Collection Time: 04/26/20  2:30 AM  Result Value Ref Range   Glucose-Capillary 101 (H) 70 - 99 mg/dL    Comment: Glucose reference range applies only to samples taken after fasting for at least 8 hours.  Comprehensive metabolic panel     Status: Abnormal    Collection Time: 04/26/20  5:48 AM  Result Value Ref Range   Sodium 139 135 - 145 mmol/L   Potassium 3.1 (L) 3.5 - 5.1 mmol/L   Chloride 109 98 - 111 mmol/L   CO2 23 22 - 32 mmol/L   Glucose, Bld 95 70 - 99 mg/dL    Comment: Glucose reference range applies only to samples taken after fasting for at least 8 hours.   BUN 12 8 - 23 mg/dL   Creatinine, Ser 0.39 (L) 0.44 - 1.00 mg/dL   Calcium 8.2 (L) 8.9 - 10.3 mg/dL   Total Protein  5.3 (L) 6.5 - 8.1 g/dL   Albumin 2.5 (L) 3.5 - 5.0 g/dL   AST 14 (L) 15 - 41 U/L   ALT 10 0 - 44 U/L   Alkaline Phosphatase 137 (H) 38 - 126 U/L   Total Bilirubin 0.2 (L) 0.3 - 1.2 mg/dL   GFR calc non Af Amer >60 >60 mL/min   GFR calc Af Amer >60 >60 mL/min   Anion gap 7 5 - 15    Comment: Performed at [facility], [address], [place], [place] [address]  CBC     Status: Abnormal   Collection Time: 04/26/20  5:48 AM  Result Value Ref Range   WBC 6.5 4.0 - 10.5 K/uL   RBC 3.19 (L) 3.87 - 5.11 MIL/uL   Hemoglobin 9.6 (L) 12.0 - 15.0 g/dL   HCT 30.4 (L) 36 - 46 %   MCV 95.3 80.0 - 100.0 fL   MCH 30.1 26.0 - 34.0 pg   MCHC 31.6 30.0 - 36.0 g/dL   RDW 16.7 (H) 11.5 - 15.5 %   Platelets 226 150 - 400 K/uL   nRBC 0.0 0.0 - 0.2 %    Comment: Performed at [facility], [address] [place], [place] [address]  Vitamin B12     Status: None   Collection Time: 04/26/20  5:48 AM  Result Value Ref Range   Vitamin B-12 698 180 - 914 pg/mL    Comment: (NOTE) This assay is not validated for testing neonatal or myeloproliferative syndrome specimens for Vitamin B12 levels. Performed at [facility], [address], [place], [place] [address]   Folate     Status: None   Collection Time: 04/26/20  5:48 AM  Result Value Ref Range   Folate 35.8 >5.9 ng/mL    Comment: RESULTS CONFIRMED BY MANUAL DILUTION Performed at [facility], [address] [place], [place] [address]   Iron and TIBC     Status: Abnormal   Collection Time: 04/26/20  5:48 AM  Result Value  Ref Range   Iron 32 28 - 170 ug/dL   TIBC 319 250 - 450 ug/dL   Saturation Ratios 10 (L) 10.4 - 31.8 %   UIBC 287 ug/dL    Comment: Performed at [facility], [address], [place], [place] [address]  Ferritin     Status: None   Collection Time: 04/26/20  5:48 AM  Result Value Ref Range   Ferritin 108 11 - 307 ng/mL    Comment: Performed at [facility], [address], [place], [place] [address]  Reticulocytes     Status: Abnormal   Collection Time: 04/26/20  5:48 AM  Result Value Ref Range   Retic Ct Pct 2.6 0.4 - 3.1 %   RBC. 3.15 (L) 3.87 - 5.11 MIL/uL   Retic Count, Absolute 82.5 19.0 - 186.0 K/uL   Immature Retic Fract 17.3 (H) 2.3 - 15.9 %    Comment: Performed at [facility], [address] [place], [place] [address]  CBG monitoring, ED     Status: None   Collection Time: 04/26/20  8:39 AM  Result Value Ref Range   Glucose-Capillary 70 70 - 99 mg/dL    Comment: Glucose reference range applies only to samples taken after fasting for at least 8 hours.  POC CBG, ED     Status: None   Collection Time: 04/26/20 10:28 AM  Result Value Ref Range   Glucose-Capillary 81 70 - 99 mg/dL    Comment:  Glucose reference range applies only to samples taken after fasting for at least 8 hours.  POC CBG, ED     Status: None   Collection Time: 04/26/20 11:13 AM  Result Value Ref Range   Glucose-Capillary 70 70 - 99 mg/dL    Comment: Glucose reference range applies only to samples taken after fasting for at least 8 hours.  CBG monitoring, ED     Status: Abnormal   Collection Time: 04/26/20 12:01 PM  Result Value Ref Range   Glucose-Capillary 68 (L) 70 - 99 mg/dL    Comment: Glucose reference range applies only to samples taken after fasting for at least 8 hours.  Culture, blood (routine x 2)     Status: None (Preliminary result)   Collection Time: 04/26/20  2:22 PM   Specimen: Right Antecubital; Blood  Result Value Ref Range   Specimen Description RIGHT ANTECUBITAL    Special Requests       BOTTLES DRAWN AEROBIC AND ANAEROBIC Blood Culture adequate volume Performed at [facility], [address] [place], [place] [address]    Culture PENDING    Report Status PENDING   Procalcitonin - Baseline     Status: None   Collection Time: 04/26/20  2:23 PM  Result Value Ref Range   Procalcitonin <0.10 ng/mL    Comment:        Interpretation: PCT (Procalcitonin) <= 0.5 ng/mL: Systemic infection (sepsis) is not likely. Local bacterial infection is possible. (NOTE)       Sepsis PCT Algorithm           Lower Respiratory Tract                                      Infection PCT Algorithm    ----------------------------     ----------------------------         PCT < 0.25 ng/mL                PCT < 0.10 ng/mL          Strongly encourage             Strongly discourage   discontinuation of antibiotics    initiation of antibiotics    ----------------------------     -----------------------------       PCT 0.25 - 0.50 ng/mL            PCT 0.10 - 0.25 ng/mL               OR       >80% decrease in PCT            Discourage initiation of                                            antibiotics      Encourage discontinuation           of antibiotics    ----------------------------     -----------------------------         PCT >= 0.50 ng/mL              PCT 0.26 - 0.50 ng/mL               AND        <80% decrease in PCT  Encourage initiation of                                             antibiotics       Encourage continuation           of antibiotics    ----------------------------     -----------------------------        PCT >= 0.50 ng/mL                  PCT > 0.50 ng/mL               AND         increase in PCT                  Strongly encourage                                      initiation of antibiotics    Strongly encourage escalation           of antibiotics                                     -----------------------------                                            PCT <= 0.25 ng/mL                                                 OR                                        > 80% decrease in PCT                                      Discontinue / Do not initiate                                             antibiotics  Performed at [facility], [address] [place], [place] [address]   Culture, blood (routine x 2)     Status: None (Preliminary result)   Collection Time: 04/26/20  2:30 PM   Specimen: BLOOD RIGHT HAND  Result Value Ref Range   Specimen Description BLOOD RIGHT HAND    Special Requests      BOTTLES DRAWN AEROBIC ONLY Blood Culture adequate volume Performed at [facility], [address], [place], [place] [address]    Culture PENDING    Report Status PENDING   SARS Coronavirus 2 by RT PCR (hospital order, performed in [place] hospital lab) Nasopharyngeal Nasopharyngeal Swab     Status: None   Collection Time: 04/26/20  2:54 PM   Specimen: Nasopharyngeal Swab  Result Value Ref Range   SARS Coronavirus 2 NEGATIVE NEGATIVE    Comment: (NOTE) SARS-CoV-2 target nucleic acids are NOT DETECTED.  The SARS-CoV-2 RNA is generally detectable in upper and lower respiratory specimens during the acute phase of infection. The lowest concentration of SARS-CoV-2 viral copies this assay can detect is 250 copies / mL. A negative result does not preclude SARS-CoV-2 infection and should not be used as the sole basis for treatment or other patient management decisions.  A negative result may occur with improper specimen collection / handling, submission of specimen other than nasopharyngeal swab, presence of viral mutation(s) within the areas targeted by this assay, and inadequate number of viral copies (<250 copies / mL). A negative result must be combined with clinical observations, patient history, and epidemiological information.  Fact Sheet for Patients:   StrictlyIdeas.no  Fact Sheet for Healthcare  Providers: BankingDealers.co.za  This test is not yet approved or  cleared by the [place] FDA and has been authorized for detection and/or diagnosis of SARS-CoV-2 by FDA under an Emergency Use Authorization (EUA).  This EUA will remain in effect (meaning this test can be used) for the duration of the COVID-19 declaration under Section 564(b)(1) of the Act, 21 U.S.C. section 360bbb-3(b)(1), unless the authorization is terminated or revoked sooner.  Performed at [facility], [address], [place], [place] [address]   Lactic acid, plasma     Status: Abnormal   Collection Time: 04/26/20  4:07 PM  Result Value Ref Range   Lactic Acid, Venous 2.1 (HH) 0.5 - 1.9 mmol/L    Comment: CRITICAL RESULT CALLED TO, READ BACK BY AND VERIFIED WITH: CRAWFORD,H ON 04/26/20 AT 1635 BY LOY,C Performed at [facility], [address] [place], [place] [address]     Studies/Results:  Head CT is reviewed person in shows hyperdense left occipital lesion with some evidence of moderate vasogenic edema.  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    Aliyah Abeyta A. Merlene Sawyer, M.D.  Diplomate, Tax adviser of Psychiatry and Neurology ( Neurology). 04/26/2020, 6:53 PM

## 2020-04-26 NOTE — ED Provider Notes (Addendum)
Observation / Boarding Note  Patient reassessed this morning.  Patient was initially seen by myself yesterday for worsening mental status changes, recurrent seizures and CT scan results showing metastasis and mild edema to the brain.  Patient has known cancer and is being treated outpatient prior to this. Dr. Sabra Heck intubated patient in place central line as patient continued to deteriorate clinically did not improve with seizure medications.  Patient is received Keppra and is on propofol.  On exam patient sedate, normal oxygenation 100%, blood pressure low 100s, afebrile. Normal respiratory rate, no abdominal distention.  No seizure activity at bedside. Plan for continued medication and observation until bed available.  CRITICAL CARE Performed by: Mariea Clonts   Total critical care time: 35 minutes  Critical care time was exclusive of separately billable procedures and treating other patients.  Critical care was necessary to treat or prevent imminent or life-threatening deterioration.  Critical care was time spent personally by me on the following activities: development of treatment plan with patient and/or surrogate as well as nursing, discussions with consultants, evaluation of patient's response to treatment, examination of patient, obtaining history from patient or surrogate, ordering and performing treatments and interventions, ordering and review of laboratory studies, ordering and review of radiographic studies, pulse oximetry and re-evaluation of patient's condition.  Patient developed a fever, plan for rectal Tylenol, blood cultures have been ordered, urinalysis reviewed no sign of infection, Covid test resent. Critical care paged for further assistance as no bed available at [facility] at this time.   Elnora Morrison, MD 04/26/20 1449    Elnora Morrison, MD 04/26/20 [phone]

## 2020-04-26 NOTE — Procedures (Signed)
Patient Name: Michele Sawyer  MRN: [id number]  Epilepsy Attending: Lora Havens  Referring Physician/Provider: Dr Heath Lark Date: 04/26/2020 Duration: 29.40 mins  Patient history: 59yo F with seizures. EEG to evaluate for seizure.  Level of alertness:  comatose  AEDs during EEG study: LEV, Propofol  Technical aspects: This EEG study was done with scalp electrodes positioned according to the 10-20 International system of electrode placement. Electrical activity was acquired at a sampling rate of 500Hz  and reviewed with a high frequency filter of 70Hz  and a low frequency filter of 1Hz . EEG data were recorded continuously and digitally stored.   Description:  EEG showed continuous generalized 3 to 6 Hz theta-delta slowing. 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  Hyperventilation and photic stimulation were not performed.     ABNORMALITY -Continuous slow, generalized -Periodic epileptiform discharges, lateralized left hemisphere  IMPRESSION: This study showed evidence of epileptogenicity arising from left hemisphere due to underlying structural abnormality. Additionally there is evidence of severe diffuse encephalopathy non specific etiology but likely secondary to sedation. No seizures were seen throughout the recording.   Michele Sawyer

## 2020-04-26 NOTE — Significant Event (Signed)
I called Dr. 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 We agreed that it was appropriate for him to have ongoing responsibility for the monitoring and  Care of Ms. Dam pending her transfer to either [facility]  or [place] for ICU admission, since it is not possible for [facility] to maintain the degree of close monitoring and ongoing care of a patient as sick as Ms. Chinchilla without the ability to physically visualize the patient. Dr. Roderic Palau indicated that he will monitor and manage the patient until she is transferred out of the ED to an ICU.  Trevor Mace, MD.

## 2020-04-26 NOTE — ED Notes (Signed)
CBG 68. RN notified.

## 2020-04-26 NOTE — ED Provider Notes (Signed)
Patient has MS metastatic adenocarcinoma.  She also had new onset seizures and has been intubated in the emergency department.  She has been accepted for admission by critical care at [facility] yesterday.  Patient was seen by Dr. Shyrl Numbers of critical care and he stated the patient needed continuous EEG monitoring which cannot be done at any [facility] hospital.  She will be transferred ED to ED with Dr. Velda Shell excepting the patient at [facility] emergency department.  Critical care can be called when the patient arrives   Milton Ferguson, MD 04/26/20 1622

## 2020-04-26 NOTE — Care Management Obs Status (Signed)
[facility] NOTIFICATION   Patient Details  Name: Michele Sawyer MRN: [id number] Date of Birth: [birth year]   Medicare Observation Status Notification Given:  Yes (mailed to [place], [place], [place] [id number])    Tommy Medal 04/26/2020, 3:25 PM

## 2020-04-26 NOTE — ED Notes (Signed)
Called [employer] for [employer]

## 2020-04-27 ENCOUNTER — Inpatient Hospital Stay (HOSPITAL_COMMUNITY): Payer: Medicare Other

## 2020-04-27 ENCOUNTER — Inpatient Hospital Stay (HOSPITAL_COMMUNITY)
Admit: 2020-04-27 | Discharge: 2020-04-27 | Disposition: A | Discharge status: Home / Self Care | Payer: Medicare Other | Attending: Pulmonary Disease | Admitting: Pulmonary Disease

## 2020-04-27 DIAGNOSIS — C801 Malignant (primary) neoplasm, unspecified: Secondary | ICD-10-CM

## 2020-04-27 DIAGNOSIS — G4089 Other seizures: Secondary | ICD-10-CM

## 2020-04-27 DIAGNOSIS — J9601 Acute respiratory failure with hypoxia: Secondary | ICD-10-CM

## 2020-04-27 LAB — CBC
HCT: 34.8 % — ABNORMAL LOW (ref 36.0–46.0)
Hemoglobin: 10.9 g/dL — ABNORMAL LOW (ref 12.0–15.0)
MCH: 30.2 pg (ref 26.0–34.0)
MCHC: 31.3 g/dL (ref 30.0–36.0)
MCV: 96.4 fL (ref 80.0–100.0)
Platelets: 177 10*3/uL (ref 150–400)
RBC: 3.61 MIL/uL — ABNORMAL LOW (ref 3.87–5.11)
RDW: 16.6 % — ABNORMAL HIGH (ref 11.5–15.5)
WBC: 14.6 10*3/uL — ABNORMAL HIGH (ref 4.0–10.5)
nRBC: 0 % (ref 0.0–0.2)

## 2020-04-27 LAB — ECHOCARDIOGRAM COMPLETE
Area-P 1/2: 3.85 cm2
Height: 67 in
S' Lateral: 2.6 cm
Weight: 2035.29 oz

## 2020-04-27 LAB — BLOOD GAS, ARTERIAL
Acid-base deficit: 0.8 mmol/L (ref 0.0–2.0)
Bicarbonate: 22.7 mmol/L (ref 20.0–28.0)
Drawn by: 51425
FIO2: 0.35
MECHVT: 490 mL
O2 Saturation: 98.1 %
PEEP: 5 cmH2O
Patient temperature: 98.6
RATE: 14 resp/min
pCO2 arterial: 35.7 mmHg (ref 32.0–48.0)
pH, Arterial: 7.42 (ref 7.350–7.450)
pO2, Arterial: 103 mmHg (ref 83.0–108.0)

## 2020-04-27 LAB — BASIC METABOLIC PANEL
Anion gap: 10 (ref 5–15)
BUN: 12 mg/dL (ref 8–23)
CO2: 21 mmol/L — ABNORMAL LOW (ref 22–32)
Calcium: 8.2 mg/dL — ABNORMAL LOW (ref 8.9–10.3)
Chloride: 108 mmol/L (ref 98–111)
Creatinine, Ser: 0.46 mg/dL (ref 0.44–1.00)
GFR calc Af Amer: >60 mL/min (ref >60)
GFR calc non Af Amer: >60 mL/min (ref >60)
Glucose, Bld: 165 mg/dL — ABNORMAL HIGH (ref 70–99)
Potassium: 3.6 mmol/L (ref 3.5–5.1)
Sodium: 139 mmol/L (ref 135–145)

## 2020-04-27 LAB — GLUCOSE, CAPILLARY
Glucose-Capillary: 160 mg/dL — ABNORMAL HIGH (ref 70–99)
Glucose-Capillary: 155 mg/dL — ABNORMAL HIGH (ref 70–99)
Glucose-Capillary: 134 mg/dL — ABNORMAL HIGH (ref 70–99)
Glucose-Capillary: 135 mg/dL — ABNORMAL HIGH (ref 70–99)
Glucose-Capillary: 115 mg/dL — ABNORMAL HIGH (ref 70–99)
Glucose-Capillary: 136 mg/dL — ABNORMAL HIGH (ref 70–99)

## 2020-04-27 LAB — HIV ANTIBODY (ROUTINE TESTING W REFLEX): HIV Screen 4th Generation wRfx: NONREACTIVE

## 2020-04-27 LAB — PHOSPHORUS: Phosphorus: 3.3 mg/dL (ref 2.5–4.6)

## 2020-04-27 LAB — MAGNESIUM: Magnesium: 1.8 mg/dL (ref 1.7–2.4)

## 2020-04-27 LAB — MRSA PCR SCREENING: MRSA by PCR: NEGATIVE

## 2020-04-27 MED ORDER — SODIUM CHLORIDE 0.9% FLUSH
10.0000 mL | Freq: Two times a day (BID) | INTRAVENOUS | Status: DC
  Administered 2020-04-27 – 2020-04-29 (×3): 10 mL

## 2020-04-27 MED ORDER — POTASSIUM CHLORIDE 20 MEQ/15ML (10%) PO SOLN
40.0000 meq | Freq: Once | ORAL | Status: AC
  Administered 2020-04-27: 40 meq
  Filled 2020-04-27: qty 30

## 2020-04-27 MED ORDER — POLYETHYLENE GLYCOL 3350 17 G PO PACK: 17.0000 g | PACK | Freq: Every day | ORAL | Status: DC | PRN

## 2020-04-27 MED ORDER — VITAL AF 1.2 CAL PO LIQD
1000.0000 mL | ORAL | Status: DC
  Administered 2020-04-27: 1000 mL

## 2020-04-27 MED ORDER — FENTANYL CITRATE (PF) 100 MCG/2ML IJ SOLN
50.0000 ug | INTRAMUSCULAR | Status: DC | PRN
  Administered 2020-04-27: 200 ug via INTRAVENOUS
  Filled 2020-04-27: qty 4

## 2020-04-27 MED ORDER — GADOBUTROL 1 MMOL/ML IV SOLN
6.0000 mL | Freq: Once | INTRAVENOUS | Status: AC | PRN
  Administered 2020-04-27: 6 mL via INTRAVENOUS

## 2020-04-27 MED ORDER — FENTANYL CITRATE (PF) 100 MCG/2ML IJ SOLN
50.0000 ug | INTRAMUSCULAR | Status: DC | PRN
  Administered 2020-04-27: 50 ug via INTRAVENOUS
  Filled 2020-04-27: qty 2

## 2020-04-27 MED ORDER — ACETAMINOPHEN 325 MG PO TABS: 650.0000 mg | ORAL_TABLET | ORAL | Status: DC | PRN

## 2020-04-27 MED ORDER — MIDAZOLAM HCL 2 MG/2ML IJ SOLN
1.0000 mg | INTRAMUSCULAR | Status: DC | PRN
  Administered 2020-04-27: 2 mg via INTRAVENOUS
  Filled 2020-04-27: qty 2

## 2020-04-27 MED ORDER — POLYETHYLENE GLYCOL 3350 17 G PO PACK
17.0000 g | PACK | Freq: Every day | ORAL | Status: DC
  Administered 2020-04-27 – 2020-04-29 (×3): 17 g via ORAL
  Filled 2020-04-27 (×3): qty 1

## 2020-04-27 MED ORDER — SODIUM CHLORIDE 0.9% FLUSH: 10.0000 mL | INTRAVENOUS | Status: DC | PRN

## 2020-04-27 MED ORDER — DOCUSATE SODIUM 50 MG/5ML PO LIQD
100.0000 mg | Freq: Two times a day (BID) | ORAL | Status: DC
  Administered 2020-04-27 – 2020-04-29 (×6): 100 mg via ORAL
  Filled 2020-04-27 (×8): qty 10

## 2020-04-27 MED ORDER — MAGNESIUM SULFATE 4 GM/100ML IV SOLN
4.0000 g | Freq: Once | INTRAVENOUS | Status: AC
  Administered 2020-04-27: 4 g via INTRAVENOUS
  Filled 2020-04-27: qty 100

## 2020-04-27 MED ORDER — VENLAFAXINE HCL 75 MG PO TABS
75.0000 mg | ORAL_TABLET | Freq: Two times a day (BID) | ORAL | Status: DC
  Administered 2020-04-27 – 2020-04-29 (×5): 75 mg
  Filled 2020-04-27 (×7): qty 1

## 2020-04-27 MED ORDER — BACLOFEN 10 MG PO TABS
5.0000 mg | ORAL_TABLET | Freq: Two times a day (BID) | ORAL | Status: DC
  Administered 2020-04-28 – 2020-04-29 (×3): 5 mg
  Filled 2020-04-27 (×3): qty 1

## 2020-04-27 MED ORDER — FAMOTIDINE 40 MG/5ML PO SUSR
20.0000 mg | Freq: Two times a day (BID) | ORAL | Status: DC
  Administered 2020-04-27 – 2020-04-29 (×6): 20 mg
  Filled 2020-04-27 (×10): qty 2.5

## 2020-04-27 MED ORDER — DEXTROSE IN LACTATED RINGERS 5 % IV SOLN
INTRAVENOUS | Status: DC
  Filled 2020-04-27: qty 1000

## 2020-04-27 MED ORDER — DOCUSATE SODIUM 100 MG PO CAPS
100.0000 mg | ORAL_CAPSULE | Freq: Two times a day (BID) | ORAL | Status: DC | PRN
  Administered 2020-04-29: 100 mg via ORAL
  Filled 2020-04-27: qty 1

## 2020-04-27 MED ORDER — OSMOLITE 1.2 CAL PO LIQD: 1000.0000 mL | ORAL | Status: DC

## 2020-04-27 NOTE — Progress Notes (Signed)
EEG complete - results pending. propfol was turned off at 1610

## 2020-04-27 NOTE — Consult Note (Addendum)
Neurology Consultation  Reason for Consult: Seizure, new brain mass Referring Physician: Critical care medicine-Dr. Lake Bells  CC: Seizures, status epilepticus, brain mass found on imaging  History is obtained from: Chart review  HPI: Michele Sawyer is a 59 y.o. 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  She appeared to be status epilepticus with intermittent right arm clenching and shaking and unresponsive to voice although she was moving her left side spontaneously according to the telemedicine neurology consultation note.  She was intubated for airway protection.  She was loaded with Keppra and given benzodiazepines.  Routine EEG was done, which showed left-sided epileptogenicity but no seizures, EEG also revealed generalized slowing.  CT head revealed a left parietal lobe lesion with surrounding edema and minor mass-effect most consistent with a metastasis.  She was in the emergency room at [facility] since and was moved to [facility] ICU about an hour or so ago and a formal neurological consultation was requested for the status epilepticus and further recommendations. She was seen yesterday by Dr. Merlene Laughter of [facility] neurology, who added Vimpat in addition to the [place].  She is also on propofol drip. Currently not exhibiting any clinical seizure activity.  Last PET CT scan done at [facility] on 04/17/2020 showed modest progression of widespread metastases along with new and increased hypermetabolic lung lesions, new focus of right hepatic lobe hypermetabolism suspicious for metastases and enlargement of the known right adrenal mass.  There were new and progressive subcutaneous and intramuscular lesion seen.  Right acetabular and proximal femoral metastases  with new cortical involvement of pathological fracture involving the weightbearing surface of the right acetabulum was also seen.  ROS:  Unable to obtain due to altered mental status.   Past Medical History:  Diagnosis Date  . Cancer [facility])    metastatic adenocarcinoma  . Liver disease   . Multiple sclerosis ([place])   . Vision abnormalities    Family History  Problem Relation Age of Onset  . Diabetes Mother   . Bone cancer Father    Social History:   reports that she has been smoking cigarettes. She has never used smokeless tobacco. She reports that she does not drink alcohol and does not use drugs.  Medications  Current Facility-Administered Medications:  .  acetaminophen (TYLENOL) suppository 650 mg, 650 mg, Rectal, Q6H PRN, Manuella Ghazi, Pratik D, DO, 650 mg at 04/26/20 1436 .  ceFEPIme (MAXIPIME) 2 g in sodium chloride 0.9 % 100 mL IVPB, 2 g, Intravenous, Q8H, Chand, Sudham, MD, Last Rate: 200 mL/hr at 04/27/20 0040, 2 g at 04/27/20 0040 .  chlorhexidine gluconate (MEDLINE KIT) (PERIDEX) 0.12 % solution 15 mL, 15 mL, Mouth Rinse, BID, Chand, Sudham, MD, 15 mL at 04/27/20 0009 .  Chlorhexidine Gluconate Cloth 2 % PADS 6 each, 6 each, Topical, Q0600, Jacky Kindle, MD, 6 each at 04/26/20 2353 .  dexamethasone (DECADRON) injection 4 mg, 4 mg, Intravenous, Q6H, Shah, Pratik D, DO, 4 mg at 04/27/20 0042 .  dextrose 5 % and 0.45 % NaCl with KCl 40 mEq/L infusion, , Intravenous, Continuous, Manuella Ghazi, Pratik D, DO, Stopped at 04/26/20 1837 .  iohexol (OMNIPAQUE) 350 MG/ML injection 60 mL, 60 mL, Intravenous, Once PRN, Elnora Morrison, MD .  lacosamide (VIMPAT) 200 mg in sodium chloride 0.9 % 25 mL IVPB, 200  mg, Intravenous, Q12H, [place], Kofi, MD, Last Rate: 90 mL/hr at 04/26/20 2157, 200 mg at 04/26/20 2157 .  levETIRAcetam (KEPPRA) IVPB 1000 mg/100 mL premix, 1,000 mg, Intravenous, Q12H, Noemi Chapel, MD, Stopped at 04/26/20 1858 .  MEDLINE mouth rinse, 15 mL, Mouth Rinse, 10 times per day, Jacky Kindle, MD .  propofol (DIPRIVAN) 1000 MG/100ML infusion, 5-80 mcg/kg/min, Intravenous, Continuous, Noemi Chapel, MD, Last Rate: 6.53 mL/hr at 04/27/20 0039, 20 mcg/kg/min at 04/27/20 0039 .  vancomycin (VANCOCIN) IVPB 1000 mg/200 mL premix, 1,000 mg, Intravenous, Q24H, Jacky Kindle, MD   Exam: Current vital signs: BP (!) 111/59   Pulse 83   Temp 98.6 F (37 C)   Resp 14   Ht 5' 7"  (1.702 m)   Wt 57.4 kg   SpO2 100%   BMI 19.82 kg/m  Vital signs in last 24 hours: Temp:  [97.3 F (36.3 C)-104.7 F (40.4 C)] 98.6 F (37 C) (08/27 2245) Pulse Rate:  [72-131] 83 (08/27 2245) Resp:  [13-27] 14 (08/27 2245) BP: (106-164)/(43-89) 111/59 (08/27 2245) SpO2:  [97 %-100 %] 100 % (08/28 0004) FiO2 (%):  [35 %-40 %] 35 % (08/28 0004) Weight:  [57.4 kg] 57.4 kg (08/28 0007) General: Intubated, sedated with propofol which was held for the exam. HEENT: Normocephalic atraumatic CVS: Regular rate rhythm but was slightly hypertensive with the systolics in the high 73Z. Respiratory: Vented Extremities warm well perfused Neurological exam Sedated intubated, sedation held for exam. No spontaneous movements To noxious stimulation, has flexion and adduction response on pinching both thighs with plantar and dorsiflexion concerning for flexor posturing bilaterally on the lower extremities.  To noxious immolation in both upper extremities, she grimaces but does not withdraw-no flexion or extension. Pupils are pinpoint and sluggishly reactive to light.  Corneal reflexes are present. Weak cough and gag on suctioning Reading over the ventilator  Labs I have reviewed labs in epic and the results pertinent to this consultation are:  CBC    Component Value Date/Time   WBC 6.5 04/26/2020 0548   RBC 3.19 (L) 04/26/2020 0548   RBC 3.15 (L) 04/26/2020 0548   HGB 9.6 (L) 04/26/2020 0548   HCT 30.4 (L) 04/26/2020 0548   PLT 226 04/26/2020 0548   MCV 95.3 04/26/2020 0548   MCH 30.1 04/26/2020 0548    MCHC 31.6 04/26/2020 0548   RDW 16.7 (H) 04/26/2020 0548   LYMPHSABS 0.3 (L) 04/25/2020 1422   MONOABS 0.5 04/25/2020 1422   EOSABS 0.1 04/25/2020 1422   BASOSABS 0.0 04/25/2020 1422    CMP     Component Value Date/Time   NA 139 04/26/2020 0548   K 3.1 (L) 04/26/2020 0548   CL 109 04/26/2020 0548   CO2 23 04/26/2020 0548   GLUCOSE 95 04/26/2020 0548   BUN 12 04/26/2020 0548   CREATININE 0.39 (L) 04/26/2020 0548   CALCIUM 8.2 (L) 04/26/2020 0548   PROT 5.3 (L) 04/26/2020 0548   PROT 6.8 03/05/2017 1500   ALBUMIN 2.5 (L) 04/26/2020 0548   ALBUMIN 4.2 03/05/2017 1500   AST 14 (L) 04/26/2020 0548   ALT 10 04/26/2020 0548   ALKPHOS 137 (H) 04/26/2020 0548   BILITOT 0.2 (L) 04/26/2020 0548   BILITOT 0.3 03/05/2017 1500   GFRNONAA >60 04/26/2020 0548   GFRAA >60 04/26/2020 0548    Imaging I have reviewed the images obtained: CT head without contrast on 04/25/2020 concerning for metastatic appearing hypodensity in the left parasagittal parietal area with mild mass-effect.  No  bleed.    Prior brain imaging from December 2020 in the [facility] system-no brain metastases.  Assessment: 59 year old with metastatic adenocarcinoma thought to be of GI source, history of multiple sclerosis, presenting with clinical seizures involving right upper extremity shaking with impaired consciousness, requiring intubation for airway protection for concern for status epilepticus. Treated with low-dose benzodiazepine and Keppra, and Vimpat added yesterday after routine EEG revealed evidence of epileptogenicity in the left hemisphere. Unfortunately, due to cortical but shortages, she is unable to get a bed at the tertiary care center capable of long-term EEG monitoring and is currently housed at [facility]. We will attempt to get a spot EEG as the technologist becomes available.  Unfortunately LTM EEG might not be possible due to infrastructural issues.  Looking at the most recent PET CT scan  report, she has widespread metastatic disease, which is now spread to involve the brain as well as adrenal glands, liver and lung along with subcutaneous and bony tissues.  I think she needs to be evaluated by oncology and palliative care medicine to have a more realistic idea on her prognosis from her malignancy now that the spread has even involve the brain.  Impression: Metastatic brain disease New onset seizure with status epilepticus Metastatic adenocarcinoma thought to be of GI origin  Recommendations: Continue Keppra 1 g twice daily Continue Vimpat 200 twice daily Continue propofol at current dose Repeat exam in the morning with sedation holiday Continue dexamethasone 4 mg every 6 hours IV MRI brain with and without contrast as soon as possible.  If cannot happen in time, repeat head CT in the morning. Routine EEG again to evaluate for any evidence of ongoing seizures. I would still recommend, that if she has full scope of care, attempt should be made to transfer her to [facility] where she could be hooked up to LTM EEG. On the other hand, looking at the patient as a whole, I would recommend an oncological consultation as well as involvement of palliative care medicine to get a realistic idea of her prognosis from the malignancy perspective-she might not have a good prognosis and options should be discussed with family in terms of how aggressive they want to be with treatment in general.   -- Amie Portland, MD [facility] Pager: [phone] If 7pm to 7am, please call on call as listed on AMION.   CRITICAL CARE ATTESTATION Performed by: Amie Portland, MD Total critical care time: 55 minutes Critical care time was exclusive of separately billable procedures and treating other patients and/or supervising APPs/Residents/Students Critical care was necessary to treat or prevent imminent or life-threatening deterioration due to metastatic brain mass, seizure, status  epilepticus This patient is critically ill and at significant risk for neurological worsening and/or death and care requires constant monitoring. Critical care was time spent personally by me on the following activities: development of treatment plan with patient and/or surrogate as well as nursing, discussions with consultants, evaluation of patient's response to treatment, examination of patient, obtaining history from patient or surrogate, ordering and performing treatments and interventions, ordering and review of laboratory studies, ordering and review of radiographic studies, pulse oximetry, re-evaluation of patient's condition, participation in multidisciplinary rounds and medical decision making of high complexity in the care of this patient.

## 2020-04-27 NOTE — Procedures (Signed)
MRN: [id number]  Epilepsy Attending: Lora Havens  Referring Physician/Provider: Dr Heath Lark Date: 04/27/2020 Duration:  23.37 mins  Patient history: 59yo F with seizures. EEG to evaluate for seizure.  Level of alertness:  comatose  AEDs during EEG study: LEV  Technical aspects: This EEG study was done with scalp electrodes positioned according to the 10-20 International system of electrode placement. Electrical activity was acquired at a sampling rate of 500Hz  and reviewed with a high frequency filter of 70Hz  and a low frequency filter of 1Hz . EEG data were recorded continuously and digitally stored.   Description:  EEG showed continuous generalized 3 to 6 Hz theta-delta slowing.  Hyperventilation and photic stimulation were not performed.     ABNORMALITY -Continuous slow, generalized  IMPRESSION: This study is suggestive of severe diffuse encephalopathy non specific etiology. No seizures and epileptiform discharges were seen throughout the recording.  EEG appears improved compared to previous day.    Davonna Ertl Barbra Sarks

## 2020-04-27 NOTE — Consult Note (Addendum)
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 Seizures  : Patient is intubated.  I have talked to her husband.  HPI: [name] seems to be a very sweet 59 year old white female.  She and her husband live up in [place].  She has a history of multiple sclerosis.  Her history of malignancy dates back probably about 6 months ago.  She began to have some pain over in the right hip and knee.  It was initially thought that this may be her multiple sclerosis.  However, work-up clearly showed that there was a lesion in the right femur.  She subsequently underwent prophylactic rodding of the right femur.  She then underwent radiation therapy.  She was found to have a metastatic adenocarcinoma.  Unfortunately, despite studies, the primary site cannot be confidently identified.  Her initial work-up seem to show that this was in her bones.  She has been treated at [facility].  She has had excellent care by Dr. Pablo Ledger, her radiation oncologist and Dr. Halford Decamp, her medical oncologist.  She has had, I think, 4 cycles of chemotherapy with FOLFOX.  She actually had a PET scan done on 04/17/2020.  Unfortunately, this showed significant disease progression.  She has not had more bony metastasis.  She was found to have lung lesions and a liver lesion.  She has subcutaneous mets.  These been treated with radiation.  She was at home with her husband.  She was just sitting down.  He says she is watching the hummingbirds outside.  She then began to have what appeared to be a seizure.  He says she was falling to her right side.  EMS was called.  I think she was sent to [facility] initially.  However, because of the complexity of her situation she was eventually moved to the ICU at [facility], she was felt to be in status epilepticus.  She  was unresponsive to voice.  A neurology consultation ensued.  She was intubated.  She was treated with Keppra and benzodiazepines.  EEG showed left-sided epileptogenicity but no seizures.  CT of the head revealed a left parietal lobe lesion with surrounding edema.  At [facility] ICU, she is intubated.  She did have an MRI today.  This showed an acute infarct in the left cerebellum.  She had a least 6 metastatic tumors in the brain.  She is sedated at the present time.  She is on ventilator for airway protection.  It is very sad and very unfortunate that despite aggressive therapy with FOLFOX, she is progressing significantly.  Her husband is very nice.  He has been married to [name] for 23 years.  I know this is incredibly difficult for him.  I talked to him about what I thought about the whole situation.  I think that we are in a situation where her prognosis is incredibly limited.  I think that she probably is being kept alive by the ventilator right now.  There is no indication for systemic therapy.  I really do not see any role for cranial radiation therapy given the systemic progression of her malignancy.  We talked about end-of-life issues.  He does not want her kept alive if something were to happen to her.  As such, she is a DO NOT RESUSCITATE.  If something were to happen while she  is on the ventilator, interventions will not be taken to keep her alive.  He does not want to see her exist on a machine.  As far as blood drawing support, we really do not talk about that all that much.  I know she is being fed right now.  This, in my opinion, probably is not going to benefit her.  I will,along with critical care, will need to talk to him about pulling back on support.  I really do not think her outcome is going to change with respect to keeping her alive artificially.  This clearly is a very aggressive malignancy.  The fact that the cancer has significantly progressed systemically  and now is in the brain, I think is evident of the aggressiveness of this malignancy has taken.  I know that she is getting outstanding care from all the staff in the ICU.  Her husband is very thankful for the compassion that has been shown to [name] during this very difficult time.        Current Facility-Administered Medications:  .  acetaminophen (TYLENOL) suppository 650 mg, 650 mg, Rectal, Q6H PRN, Milagros Loll, MD, 650 mg at 04/26/20 1436 .  acetaminophen (TYLENOL) tablet 650 mg, 650 mg, Oral, Q4H PRN, Milagros Loll, MD .  Derrill Memo ON 04/28/2020] baclofen (LIORESAL) tablet 5 mg, 5 mg, Per Tube, BID, Jacques Earthly T, MD .  ceFEPIme (MAXIPIME) 2 g in sodium chloride 0.9 % 100 mL IVPB, 2 g, Intravenous, Q8H, Milagros Loll, MD, Stopped at 04/27/20 0930 .  chlorhexidine gluconate (MEDLINE KIT) (PERIDEX) 0.12 % solution 15 mL, 15 mL, Mouth Rinse, BID, Milagros Loll, MD, 15 mL at 04/27/20 3154 .  Chlorhexidine Gluconate Cloth 2 % PADS 6 each, 6 each, Topical, Q0600, Milagros Loll, MD, 6 each at 04/26/20 2353 .  dexamethasone (DECADRON) injection 4 mg, 4 mg, Intravenous, Q6H, Milagros Loll, MD, 4 mg at 04/27/20 1211 .  dextrose 5 % in lactated ringers infusion, , Intravenous, Continuous, Ramaswamy, Murali, MD, Last Rate: 50 mL/hr at 04/27/20 0853, New Bag at 04/27/20 0853 .  docusate (COLACE) 50 MG/5ML liquid 100 mg, 100 mg, Oral, BID, Milagros Loll, MD, 100 mg at 04/27/20 1039 .  docusate sodium (COLACE) capsule 100 mg, 100 mg, Oral, BID PRN, Milagros Loll, MD .  famotidine (PEPCID) 40 MG/5ML suspension 20 mg, 20 mg, Per Tube, BID, Milagros Loll, MD, 20 mg at 04/27/20 1039 .  feeding supplement (VITAL AF 1.2 CAL) liquid 1,000 mL, 1,000 mL, Per Tube, Continuous, Ramaswamy, Murali, MD, Last Rate: 65 mL/hr at 04/27/20 1434, 1,000 mL at 04/27/20 1434 .  fentaNYL (SUBLIMAZE) injection 50 mcg, 50 mcg, Intravenous, Q15 min PRN, Milagros Loll, MD, 50 mcg at  04/27/20 0839 .  fentaNYL (SUBLIMAZE) injection 50-200 mcg, 50-200 mcg, Intravenous, Q2H PRN, Brand Males, MD, 50 mcg at 04/27/20 1453 .  iohexol (OMNIPAQUE) 350 MG/ML injection 60 mL, 60 mL, Intravenous, Once PRN, Milagros Loll, MD .  lacosamide (VIMPAT) 200 mg in sodium chloride 0.9 % 25 mL IVPB, 200 mg, Intravenous, Q12H, Milagros Loll, MD, Stopped at 04/27/20 1157 .  levETIRAcetam (KEPPRA) IVPB 1000 mg/100 mL premix, 1,000 mg, Intravenous, Q12H, Milagros Loll, MD, Stopped at 04/27/20 [phone] .  MEDLINE mouth rinse, 15 mL, Mouth Rinse, 10 times per day, Jacques Earthly T, MD, 15 mL at 04/27/20 1431 .  midazolam (VERSED) injection 1-4 mg, 1-4 mg, Intravenous, Q2H PRN, Brand Males, MD,  2 mg at 04/27/20 1300 .  polyethylene glycol (MIRALAX / GLYCOLAX) packet 17 g, 17 g, Oral, Daily PRN, Jacques Earthly T, MD .  polyethylene glycol (MIRALAX / GLYCOLAX) packet 17 g, 17 g, Oral, Daily, Milagros Loll, MD, 17 g at 04/27/20 1039 .  propofol (DIPRIVAN) 1000 MG/100ML infusion, 5-80 mcg/kg/min, Intravenous, Continuous, Milagros Loll, MD, Last Rate: 8.16 mL/hr at 04/27/20 1602, 25 mcg/kg/min at 04/27/20 1602 .  sodium chloride flush (NS) 0.9 % injection 10-40 mL, 10-40 mL, Intracatheter, Q12H, Ramaswamy, Murali, MD .  sodium chloride flush (NS) 0.9 % injection 10-40 mL, 10-40 mL, Intracatheter, PRN, Brand Males, MD .  venlafaxine [facility]) tablet 75 mg, 75 mg, Per Tube, BID WC, Milagros Loll, MD, 75 mg at 04/27/20 0813:  . [START ON 04/28/2020] baclofen  5 mg Per Tube BID  . chlorhexidine gluconate (MEDLINE KIT)  15 mL Mouth Rinse BID  . Chlorhexidine Gluconate Cloth  6 each Topical Q0600  . dexamethasone (DECADRON) injection  4 mg Intravenous Q6H  . docusate  100 mg Oral BID  . famotidine  20 mg Per Tube BID  . mouth rinse  15 mL Mouth Rinse 10 times per day  . polyethylene glycol  17 g Oral Daily  . sodium chloride flush  10-40 mL Intracatheter Q12H  . venlafaxine   75 mg Per Tube BID WC  :  Allergies  Allergen Reactions  . Penicillins Swelling    Swell rash  :  Family History  Problem Relation Age of Onset  . Diabetes Mother   . Bone cancer Father   :  Social History   Socioeconomic History  . Marital status: Married    Spouse name: Not on file  . Number of children: Not on file  . Years of education: Not on file  . Highest education level: Not on file  Occupational History  . Not on file  Tobacco Use  . Smoking status: Current Every Day Smoker    Types: Cigarettes  . Smokeless tobacco: Never Used  Substance and Sexual Activity  . Alcohol use: No  . Drug use: No  . Sexual activity: Not on file  Other Topics Concern  . Not on file  Social History Narrative  . Not on file   Social Determinants of Health   Financial Resource Strain:   . Difficulty of Paying Living Expenses: Not on file  Food Insecurity:   . Worried About Charity fundraiser in the Last Year: Not on file  . Ran Out of Food in the Last Year: Not on file  Transportation Needs:   . Lack of Transportation (Medical): Not on file  . Lack of Transportation (Non-Medical): Not on file  Physical Activity:   . Days of Exercise per Week: Not on file  . Minutes of Exercise per Session: Not on file  Stress:   . Feeling of Stress : Not on file  Social Connections:   . Frequency of Communication with Friends and Family: Not on file  . Frequency of Social Gatherings with Friends and Family: Not on file  . Attends Religious Services: Not on file  . Active Member of Clubs or Organizations: Not on file  . Attends Archivist Meetings: Not on file  . Marital Status: Not on file  Intimate Partner Violence:   . Fear of Current or Ex-Partner: Not on file  . Emotionally Abused: Not on file  . Physically Abused: Not on file  . Sexually Abused:  Not on file  :   Not obtainable secondary to patient being intubated  Exam: See above Patient Vitals for the past 24  hrs:  BP Temp Temp src Pulse Resp SpO2 Height Weight  04/27/20 1604 -- -- -- -- -- 97 % -- --  04/27/20 1500 (!) 133/52 -- -- 78 (!) 23 (!) 87 % -- --  04/27/20 1200 (!) 122/52 97.7 F (36.5 C) -- 72 16 94 % -- --  04/27/20 1142 -- 97.9 F (36.6 C) -- 84 (!) 26 95 % -- --  04/27/20 1000 -- 98.4 F (36.9 C) -- 73 15 97 % -- --  04/27/20 0914 -- 98.4 F (36.9 C) -- 90 (!) 26 100 % -- --  04/27/20 0900 (!) 126/52 98.4 F (36.9 C) -- 78 19 94 % -- --  04/27/20 0800 (!) 129/44 (!) 97.3 F (36.3 C) Bladder 83 (!) 27 95 % -- --  04/27/20 0700 (!) 96/44 98.4 F (36.9 C) -- 70 18 98 % -- --  04/27/20 0600 (!) 105/48 98.4 F (36.9 C) -- 73 16 98 % -- --  04/27/20 0500 (!) 144/69 98.2 F (36.8 C) -- 87 (!) 26 98 % -- 127 lb 3.3 oz (57.7 kg)  04/27/20 0429 -- -- -- -- -- 100 % -- --  04/27/20 0400 (!) 104/53 97.7 F (36.5 C) Core 68 15 99 % -- --  04/27/20 0300 (!) 106/49 98.2 F (36.8 C) Core 68 15 99 % -- --  04/27/20 0200 (!) 117/51 (!) 97.5 F (36.4 C) Core 74 14 100 % -- --  04/27/20 0100 (!) 94/44 (!) 97.5 F (36.4 C) Core 72 16 100 % -- --  04/27/20 0045 (!) 106/49 (!) 97.5 F (36.4 C) -- 72 15 100 % -- --  04/27/20 0030 (!) 91/44 (!) 97.5 F (36.4 C) -- 72 15 99 % -- --  04/27/20 0015 (!) 124/59 97.9 F (36.6 C) -- 73 14 100 % -- --  04/27/20 0007 -- -- -- -- -- -- -- 126 lb 8.7 oz (57.4 kg)  04/27/20 0004 -- -- -- -- -- 100 % -- --  04/26/20 2245 (!) 111/59 98.6 F (37 C) -- 83 14 100 % -- --  04/26/20 2230 133/68 98.6 F (37 C) -- 92 19 100 % _0  (1.702 m) --  04/26/20 2210 -- 98.4 F (36.9 C) -- 90 17 100 % -- --  04/26/20 2200 119/67 (!) 97.3 F (36.3 C) -- 80 14 100 % -- --  04/26/20 2100 133/63 98.8 F (37.1 C) -- 86 14 100 % -- --  04/26/20 2040 -- 99.1 F (37.3 C) -- 84 14 100 % -- --  04/26/20 2030 (!) 110/48 99.5 F (37.5 C) -- 85 14 100 % -- --  04/26/20 2000 (!) 116/47 99.9 F (37.7 C) -- 92 14 100 % -- --  04/26/20 1844 -- (!) 100.8 F (38.2 C) --  (!) 110 19 100 % -- --  04/26/20 1815 -- (!) 101.8 F (38.8 C) -- (!) 112 19 100 % -- --  04/26/20 1800 (!) 132/48 (!) 102 F (38.9 C) -- (!) 112 15 100 % -- --  04/26/20 1730 (!) 109/54 (!) 102.9 F (39.4 C) -- (!) 112 15 99 % -- --  04/26/20 1700 (!) 108/43 (!) 103.6 F (39.8 C) -- (!) 103 15 99 % -- --  04/26/20 1647 (!) 106/47 (!) 103.8 F (39.9 C) -- (!) 106 16  99 % -- --  04/26/20 1645 -- (!) 103.8 F (39.9 C) -- (!) 107 17 100 % -- --     Recent Labs    04/26/20 0548 04/27/20 0313  WBC 6.5 14.6*  HGB 9.6* 10.9*  HCT 30.4* 34.8*  PLT 226 177   Recent Labs    04/26/20 0548 04/27/20 0313  NA 139 139  K 3.1* 3.6  CL 109 108  CO2 23 21*  GLUCOSE 95 165*  BUN 12 12  CREATININE 0.39* 0.46  CALCIUM 8.2* 8.2*    Blood smear review: None  Pathology: None    Assessment and Plan: [name] is a very nice 59 year old white female.  She has a progressive metastatic adenocarcinoma of unclear primary.  It is now in her brain.  She had a seizure.  She also had a stroke.  I would suspect that she is definitely hypercoagulable secondary to her malignancy.  I would not put on anticoagulation given the "big picture" of her incredibly poor prognosis.  Again, in speaking to her husband, I told him that I really thought that she probably had no more than 2 weeks.  I think if she were to be taken off life support and other measures were withdrawn for support, she may have no more than a couple days.  I know this is a lot for him to think about.  All of this has come about in a very short amount of time.  I told him that we would maintain her current level of support but that nothing additional would be added.  If she were to have a life-threatening event, we would not intervene and we would just let her pass on to [facility].  He is okay with doing this.  We will have to talk with him about withdrawing nutritional support.  I am not sure this is really going to affect the overall  prognosis.  I just hate having to meet him and his lovely wife for the first time in this situation.  He is very nice.  He obviously loves his wife.  He just wants her to be comfortable and not suffer and exist.  I very much appreciate the outstanding care that she is getting from the ICU staff.  Michele Haw, MD  2 Timothy 4:18

## 2020-04-27 NOTE — Progress Notes (Signed)
Dr. Chase Caller reviewed images from [place] and gave consent ot go ahead and use OG tube for feeding

## 2020-04-27 NOTE — Progress Notes (Signed)
Initial Nutrition Assessment  DOCUMENTATION CODES:   Not applicable  INTERVENTION:  Initiate Vital 1.2 @ 45 ml/hr via OGT, advance 10 ml every 6 hrs as tolerated to goal rate 65 ml/hr -This regimen will provide 1872 kcal (2044 kcal total with propofol at current rate, meeting 98% of kcal needs), 117 grams of protein (102% of estimated protein needs) and 1264 ml free water  Additional free water flushes per MD   NUTRITION DIAGNOSIS:   Inadequate oral intake related to inability to eat as evidenced by NPO status.  GOAL:   Provide needs based on ASPEN/SCCM guidelines  MONITOR:   Labs, Vent status, I & O's, TF tolerance, Weight trends  REASON FOR ASSESSMENT:   Consult Enteral/tube feeding initiation and management  ASSESSMENT:  RD working remotely.    59 year old female with history of metastatic adenocarcinoma probable of GI origin s/p radiation undergoing chemo, and MS presented with seizures admitted for status epilepticus.  Patient unresponsive on ED arrival, subsequently intubated for airway protection on 04/25/20.  She is followed at [facility], started on [place] with 2 days 5-FU pump on 5/27, last RXT on 6/21.  Per care everywhere,weights have trended down ~17 lbs (11.9%) in the last 4 months; significant. Pt weighed 56.6 kg on 7/22,  weighed 60.3 kg on 6/24, weighed 60.1 kg on 5/27, weighed 65.5 kg on 4/22. Given trends as well as significant history, highly suspect degree of malnutrition however unable to identify at this time without  NFPE, will plan to complete at follow-up.   BP: 130/64 (cuff) MAP: 81 (cuff)  Patient is currently intubated and sedated on ventilator support MV: 13.3 L/min Temp (24hrs), Avg:100.4 F (38 C), Min:97.3 F (36.3 C), Max:104.7 F (40.4 C)  Propofol: 6.53  ml/hr provides 172 kcal Medications reviewed and include: Decadron, Colace, Miralax, Pepcid, Effoxor Drips: Maxipime Vimpat Keppra Mg Sulfate  IVF: D5 in lactated ringers @ 50  ml/hr  Labs: CBGs 135,134,155,160, WBC 14.6 (H), Hgb 10.9 (L), HCT 34.8 (L)  Per notes:  -spot EEG and MRI are pending -1 cm lesion of left parietal lobe with surrounding edema, minor mass effect on head CT -CXR with left upper nodule consistent with PET -dose adjusted therapy at 75% d/t blood counts  NUTRITION - FOCUSED PHYSICAL EXAM: Unable to perform at this time, RD working remotely.  Diet Order:   Diet Order    None      EDUCATION NEEDS:   No education needs have been identified at this time  Skin:  Skin Assessment: Reviewed RN Assessment  Last BM:  8/28-type 4  Height:   Ht Readings from Last 1 Encounters:  04/26/20 5\' 7"  (1.702 m)    Weight:   Wt Readings from Last 1 Encounters:  04/27/20 57.7 kg    Ideal Body Weight:  61.4 kg  BMI:  Body mass index is 19.92 kg/m.  Estimated Nutritional Needs:   Kcal:  2079  Protein:  100-115  Fluid:  >/= 2 L/day   Lajuan Lines, RD, LDN Clinical Nutrition After Hours/Weekend Pager # in [place]

## 2020-04-27 NOTE — Progress Notes (Signed)
Echocardiogram 2D Echocardiogram has been performed.  Michele Sawyer 04/27/2020, 4:02 PM

## 2020-04-27 NOTE — Progress Notes (Signed)
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  Unable to obtain history from patient due to encephalopathy, intubation. In the ED, she was intubated for airway protection. She received Keppra, Ativan. She was given 70m of Decadron. She was started on a propofol gtt. 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   has a past surgical history that includes Partial hysterectomy and Eye surgery (Left).  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 WPablo Ledgerin radiologyand Dr. WRedmond Pullingin orthopedics.It is thought to be of GI primary therefore patient was started on FOLFOX therapywith 2 days on at-home 5-FU pump.Starting with treatmenton 01/25/2020, she will be on dose-adjusted therapyat 75% of original dosebecause of her blood counts.She started radiation therapy on 02/19/2020. .Marland KitchenHer ECOG performance status is 2.  PET SCAn 8/18 IMPRESSION:  1. Moderate progression of widespread metastasis as detailed above.  New and increased hypermetabolic lung lesions, a new focus of right  hepatic lobe hypermetabolism (suspicious for metastasis) and  enlargement of a right adrenal metastasis.  2. New and progressive subcutaneous and intramuscular  lesions as  detailed above.  3. Right acetabular and proximal femoral metastasis, with new  cortical involvement and pathologic fracture involving the  weight-bearing surface of the right acetabulum.  4. Incidental findings, including: Aortic atherosclerosis  (ICD10-I70.0), coronary artery atherosclerosis and emphysema  (ICD10-J43.9).      Electronically Signed  By: KAbigail MiyamotoM.D.  On: 04/17/2020 15:07      Significant Hospital Events   8/26 >Int[facility] 8/28 >[facility]  Consults:  Neuro 8/26  Procedures:  ETT 8/26  Significant Diagnostic Tests:  CT head w/o contrast 8/26> 1 cm lesion of the parasagittal left parietal lobe with surrounding edema and minor mass effect.  CXR 8/26> Left upper lobe nodule corresponds to nodule on PET. Known right infrahilar nodule not well seen by radiograph  Micro Data:  COVID19 8/26 > neg BCx x2 8/19 > Resp Cx 8/28 >   Antimicrobials:  vanc 8/27>8/27 Cefepime 8/27>  Interim history/subjective:   04/27/2020 - [facility] . No seizures per RN but agitated on diprivan. Spot EEG today - still pending. MRI - pending. Has OG tube. Not on pressors.   Objective   Blood pressure (!) 129/44, pulse 83, temperature (!) 97.3 F (36.3 C), temperature source Bladder, resp. rate (!) 27, height _0  (1.702 m), weight 57.7 kg, SpO2 95 %.    Vent Mode: PRVC FiO2 (%):  [35 %-40 %] 35 % Set Rate:  [14 bmp] 14 bmp Vt Set:  [490 mL-520 mL] 490 mL PEEP:  [5 cmH20] 5 cmH20 Plateau Pressure:  [18 cmH20-26 cmH20] 18 cmH20   Intake/Output Summary (Last 24 hours) at 04/27/2020 0847 Last data filed at 04/27/2020 0700 Gross per 24 hour  Intake 3485.9 ml  Output 1275 ml  Net 2210.9 ml   Filed Weights   04/25/20 1423 04/27/20 0007 04/27/20 0500  Weight: 54.4 kg 57.4 kg 57.7 kg     General Appearance:  Looks criticall ill. On vent Head:  Normocephalic, without obvious abnormality, atraumatic Eyes:  PERRL -  yes, conjunctiva/corneas - muddy     Ears:  Normal external ear canals, both ears Nose:  G tube - n Throat:  ETT TUBE - yes , OG tube - yes Neck:  Supple,  No enlargement/tenderness/nodules Lungs: Clear to auscultation bilaterally, Ventilator   Synchrony - no , restless Heart:  S1 and S2 normal, no murmur, CVP - no.  Pressors - no Abdomen:  Soft, no masses, no organomegaly Genitalia / Rectal:  Not done Extremities:  Extremities- intact Skin:  ntact in exposed areas . Sacral area - not examined Neurologic:  Sedation - diprivan gtt -> RASS - -2 . Moves all 4s - yes. CAM-ICU - positive for delirium . 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     VASCULAR A:   AT risk for circulatory shock  P:  MAP goal > 65  CARDIAC STRUCTURAL A: Need to rule out cardiomyopathy  P: Trop Check echo  CARDIAC ELECTRICAL A: At risk for A Fib   P: tele  INFECTIOUS A:   SIRS - : Elevated WBC and fever 105F . MRSA PCR screening neg. Sepsis being ruled out   04/27/2020 - afebrile  P:   --Follow up blood and respiratory cultures --Discontinue vanc. Continue cefepime for now  RENAL A:  At risk for AKI P:  Fluids  ELECTROLYTES A:  Mild low K and low mag P: repelte both   GASTROINTESTINAL A:   Has OG  P:   Check KUB nutn consult  HEMATOLOGIC   - HEME A:  Chronic anemia: Hgb at baseline of around 10.5    P:  - PRBC for hgb </= 6.9gm%    - exceptions are   -  if ACS susepcted/confirmed then transfuse for hgb </= 8.0gm%,  or    -  active bleeding with hemodynamic instability, then transfuse regardless of hemoglobin value  At at all times try to transfuse 1 unit prbc as possible with exception of active hemorrhage   ENDOCRINE A:   At risk hypo and hyperglycemia    P:   SSI    Best practice:  Diet: NPO but consider TF Pain/Anxiety/Delirium protocol (if indicated): fent prn and prop gtt and versed prn VAP protocol (if indicated): Ordered DVT prophylaxis: SCDs GI prophylaxis: H2B Glucose control: Monitor Mobility: Bed rest  Code Status: FULL . -  8/28 - Husband says no MD has told that patient's cancer is "hopeless" and so till then he is looking for Rx to fight cancer. He says he does not want to hear ever if is hopeless and says he will deal with his emotions then.   Family Communication: No family at bedside. Husband Chrystina Naff 04/27/20 - says he is worried that she is on ventilator -> explained risk for her contagion with covid in hospital and for him as visitior -> would be very rare events. He is very concerned about ventilator -> a) explained ventilator is marker of severe illness;  b) figuring out illness and prognosis and giving [place] time; c) explained risks of being on ventilator - VILI, VAP etc., ;d ) explained MRI and spot EEG today; e) explained no beds at [facility] for c-EEG; f) explained will get local Onc consultation; g)   updated over phone.   Disposition: ICU at [facility] -> get spot eeg and onc consultation -> then based on course decide on tx to Neuro ICU [facility]     ATTESTATION & SIGNATURE   The patient RHANDI DESPAIN is critically ill with multiple organ systems failure and requires high complexity decision making for assessment and support, frequent evaluation and titration of therapies, application of advanced monitoring technologies and extensive interpretation of multiple databases.   Critical Care Time devoted to patient care services described in this note is  60  Minutes. This time reflects time of care of this signee Dr Brand Males. This critical care time does not reflect procedure time, or teaching time or supervisory time of PA/NP/Med student/Med Resident etc but could involve care discussion time     Dr. Brand Males, M.D., [facility].C.P Pulmonary and Critical Care Medicine Staff Physician [place] [facility] Pager: [phone], If no answer or between  15:00h - 7:00h: call [phone]  04/27/2020 8:47 AM    LABS    PULMONARY Recent Labs  Lab 04/25/20 1439 04/26/20 2220 04/27/20 0410  PHART  --  7.468* 7.420  PCO2ART  --  31.9* 35.7  PO2ART  --  148* 103  HCO3  --  24.5 22.7  TCO2 30  --   --   O2SAT  --  99.2 98.1    CBC Recent Labs  Lab 04/25/20 1422 04/25/20 1422 04/25/20 1439 04/26/20 0548 04/27/20 0313  HGB 10.4*   < > 11.2* 9.6* 10.9*  HCT 32.6*   < > 33.0* 30.4* 34.8*  WBC 5.7  --   --  6.5 14.6*  PLT 227  --   --  226 177   < > = values in this interval not displayed.    COAGULATION Recent Labs  Lab 04/25/20 1422  INR 1.0    CARDIAC  No results for input(s):  TROPONINI in the last 168 hours. No results for input(s): PROBNP in the last 168 hours.   CHEMISTRY Recent Labs  Lab 04/25/20 1422 04/25/20 1422 04/25/20  1439 04/25/20 1439 04/26/20 0548 04/27/20 0313  NA 138  --  142  --  139 139  K 3.0*   < > 3.6   < > 3.1* 3.6  CL 104  --  101  --  109 108  CO2 24  --   --   --  23 21*  GLUCOSE 107*  --  101*  --  95 165*  BUN 12  --  12  --  12 12  CREATININE 0.40*  --  0.40*  --  0.39* 0.46  CALCIUM 8.2*  --   --   --  8.2* 8.2*  MG  --   --   --   --   --  1.8  PHOS  --   --   --   --   --  3.3   < > = values in this interval not displayed.   Estimated Creatinine Clearance: 67.3 mL/min (by C-G formula based on SCr of 0.46 mg/dL).   LIVER Recent Labs  Lab 04/25/20 1422 04/26/20 0548  AST 16 14*  ALT 10 10  ALKPHOS 153* 137*  BILITOT 0.5 0.2*  PROT 5.8* 5.3*  ALBUMIN 2.8* 2.5*  INR 1.0  --      INFECTIOUS Recent Labs  Lab 04/26/20 1423 04/26/20 1607 04/26/20 1802  LATICACIDVEN  --  2.1* 1.3  PROCALCITON <0.10  --   --      ENDOCRINE CBG (last 3)  Recent Labs    04/27/20 0009 04/27/20 0315 04/27/20 0810  GLUCAP 160* 155* 134*         IMAGING x48h  - image(s) personally visualized  -   highlighted in bold DG Chest 1 View  Result Date: 04/26/2020 CLINICAL DATA:  Fever.  Hypoxia EXAM: CHEST  1 VIEW COMPARISON:  April 25, 2020 chest radiograph; PET-CT April 17, 2020 FINDINGS: Endotracheal tube tip is 2.9 cm above the carina. Nasogastric tube tip and side port are below the diaphragm. Central catheter tip is in the superior vena cava. No pneumothorax. Ill-defined nodular opacity in the left upper lobe is partially obscured by monitor lead. No edema or airspace opacity. Heart size and pulmonary vascularity are normal. No adenopathy. There are calcified breast implants bilaterally. IMPRESSION: Tube and catheter positions as described without pneumothorax. Apparent mass in the left upper lobe is not well seen on  this study, in part due to overlapping of monitor lead. No edema or airspace opacity. Stable cardiac silhouette. Electronically Signed   By: Lowella Grip III M.D.   On: 04/26/2020 14:44   DG Chest Port 1 View  Result Date: 04/27/2020 CLINICAL DATA:  Intubated, short of breath EXAM: PORTABLE CHEST 1 VIEW COMPARISON:  04/26/2020, 04/25/2020 FINDINGS: Endotracheal tube tip is about 2.3 cm superior to the carina. Esophageal tube tip is below the diaphragm but incompletely visualized. Right-sided central venous port tip over the SVC. Ill-defined left upper lobe lung nodule. Streaky atelectasis at the left base. Stable cardiomediastinal silhouette. No pneumothorax. IMPRESSION: Endotracheal tube tip about 2.3 cm superior to the carina. Ill-defined left upper lobe lung nodule as before. Streaky atelectasis at the left base Electronically Signed   By: Donavan Foil M.D.   On: 04/27/2020 03:29   DG Chest Portable 1 View  Result Date: 04/25/2020 CLINICAL DATA:  Endotracheal and OG tube placement. EXAM: PORTABLE CHEST 1 VIEW COMPARISON:  PET CT 04/17/2020 FINDINGS: Endotracheal tube tip 2.3 cm from the carina. No orogastric tube is visualized. There is a  right chest port with tip in the SVC. Heart is normal in size with normal mediastinal contours. Nodular opacity in the left upper lobe corresponds to nodule on PET. Central right infrahilar nodule on PET not well seen by radiograph. There is no pleural effusion or pneumothorax. Normal pulmonary vasculature. No acute osseous abnormalities are seen. IMPRESSION: 1. Endotracheal tube tip 2.3 cm from the carina. 2. No enteric tube is visualized. 3. Left upper lobe nodule corresponds to nodule on PET. Known right infrahilar nodule not well seen by radiograph Electronically Signed   By: Keith Rake M.D.   On: 04/25/2020 17:19   EEG adult  Result Date: 04/26/2020 Lora Havens, MD     04/26/2020  6:31 PM Patient Name: STEPHENIA VOGAN MRN: [id number] Epilepsy  Attending: Lora Havens Referring Physician/Provider: Dr Heath Lark Date: 04/26/2020 Duration: 29.40 mins Patient history: 59yo F with seizures. EEG to evaluate for seizure. Level of alertness:  comatose AEDs during EEG study: LEV, Propofol Technical aspects: This EEG study was done with scalp electrodes positioned according to the 10-20 International system of electrode placement. Electrical activity was acquired at a sampling rate of _0  and reviewed with a high frequency filter of _1  and a low frequency filter of _2 . EEG data were recorded continuously and digitally stored. Description:  EEG showed continuous generalized 3 to 6 Hz theta-delta slowing. Periodic epileptiform discharges in left hemisphere maximal P7 were noted at 0.25-0._3 .  Hyperventilation and photic stimulation were not performed.   ABNORMALITY -Continuous slow, generalized -Periodic epileptiform discharges, lateralized left hemisphere IMPRESSION: This study showed evidence of epileptogenicity arising from left hemisphere due to underlying structural abnormality. Additionally there is evidence of severe diffuse encephalopathy non specific etiology but likely secondary to sedation. No seizures were seen throughout the recording. Lora Havens   CT HEAD CODE STROKE WO CONTRAST  Result Date: 04/25/2020 CLINICAL DATA:  Code stroke. Right facial droop, suspected new seizure, history of MS and metastatic adenocarcinoma of unknown primary EXAM: CT HEAD WITHOUT CONTRAST TECHNIQUE: Contiguous axial images were obtained from the base of the skull through the vertex without intravenous contrast. COMPARISON:  Correlation made with prior MRI from 2020 FINDINGS: Motion artifact is present despite repeat scanning. Brain: There is a mildly hyperdense lesion of the parasagittal left parietal lobe near the calcarine sulcus measuring 1 cm. Surrounding hypoattenuation in the white matter of the parietal lobe likely reflects associated vasogenic  edema. Mass effect is minor. No acute intracranial hemorrhage. Gray-white differentiation appears preserved within above limitation. Prominence of the ventricles and sulci reflects generalized parenchymal volume loss. Patchy and confluent areas of hypoattenuation in the supratentorial white matter nonspecific but probably reflect chronic demyelination and microvascular ischemic changes. Vascular: No hyperdense vessel. Skull: Unremarkable. Sinuses/Orbits: No acute abnormality. Other: Mastoid air cells are clear. IMPRESSION: Motion degraded. 1 cm lesion of the parasagittal left parietal lobe with surrounding edema and minor mass effect. Most consistent with a metastasis. Contrast enhanced MRI is recommended for further evaluation. These results were called by telephone at the time of interpretation on 04/25/2020 at 2:55 pm to provider [facility] , who verbally acknowledged these results. Electronically Signed   By: Macy Mis M.D.   On: 04/25/2020 15:00

## 2020-04-27 NOTE — H&P (Signed)
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  Unable to obtain history from patient due to encephalopathy, intubation. In the ED, she was intubated for airway protection. She received Keppra, Ativan. She was given 10mg  of Decadron. She was started on a propofol gtt. 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  CXR 8/26> Left upper lobe nodule corresponds to nodule on PET. Known right infrahilar nodule not well seen by radiograph  Micro Data:  COVID19 8/26 > neg BCx x2 8/19 > Resp Cx 8/28 >   Antimicrobials:  vanc 8/27>8/27 Cefepime 8/27>  Interim history/subjective:  Intubated/sedated  Objective   Blood pressure (!) 94/44, pulse 72, temperature (!) 97.5 F (36.4 C), temperature source Oral, resp. rate 16, height 5\' 7"  (1.702 m), weight 57.4 kg, SpO2 100 %.    Vent Mode: PRVC FiO2 (%):  [35 %-40 %] 40 % Set Rate:  [14 bmp] 14 bmp Vt Set:  [490 mL-520 mL] 490  mL PEEP:  [5 cmH20] 5 cmH20 Plateau Pressure:  [18 cmH20-26 cmH20] 19 cmH20   Intake/Output Summary (Last 24 hours) at 04/27/2020 0345 Last data filed at 04/27/2020 0100 Gross per 24 hour  Intake 2902.75 ml  Output 1715 ml  Net 1187.75 ml   Filed Weights   04/25/20 1423 04/27/20 0007  Weight: 54.4 kg 57.4 kg    Examination: General: NAD HENT: [place]/AT Lungs: mechanical breath sounds, no wheezes Cardiovascular: RRR Abdomen: Soft, nondistended, nontender Extremities: no LE edema Neuro: Sedated, not following commands   Assessment & Plan:  59 year old woman with history of 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  --Appreciate neuro recs --Continue Keppra and Vimpat --Continue dexamethasone --MRI brain with and without contrast.  --EEG --Pending imaging findings, will need further goals of care discussed. May consider discussing with her outpatient oncologist  Possible sepsis of unknown origin: Elevated WBC and fever. MRSA PCR screening neg.  --Follow up blood and respiratory cultures --Discontinue vanc. Continue cefepime for now  Lactic acidosis: improved  Chronic anemia: Hgb at baseline of around 10.5  Best practice:  Diet: NPO Pain/Anxiety/Delirium protocol (if indicated): fent prn and prop gtt VAP protocol (if indicated): Ordered DVT prophylaxis: SCDs GI prophylaxis: H2B Glucose control: Monitor Mobility: PT when able Code Status: FULL Family Communication: No family at bedside Disposition: ICU  Labs  CBC: Recent Labs  Lab 04/25/20 1422 04/25/20 1439 04/26/20 0548 04/27/20 0313  WBC 5.7  --  6.5 14.6*  NEUTROABS 4.7  --   --   --   HGB 10.4* 11.2* 9.6* 10.9*  HCT 32.6* 33.0* 30.4* 34.8*  MCV 96.7  --   95.3 96.4  PLT 227  --  226 174    Basic Metabolic Panel: Recent Labs  Lab 04/25/20 1422 04/25/20 1439 04/26/20 0548  NA 138 142 139  K 3.0* 3.6 3.1*  CL 104 101 109  CO2 24  --  23  GLUCOSE 107* 101* 95  BUN 12 12 12   CREATININE 0.40* 0.40* 0.39*  CALCIUM 8.2*  --  8.2*   GFR: Estimated Creatinine Clearance: 66.9 mL/min (A) (by C-G formula based on SCr of 0.39 mg/dL (L)). Recent Labs  Lab 04/25/20 1422 04/26/20 0548 04/26/20 1423 04/26/20 1607 04/26/20 1802 04/27/20 0313  PROCALCITON  --   --  <0.10  --   --   --   WBC 5.7 6.5  --   --   --  14.6*  LATICACIDVEN  --   --   --  2.1* 1.3  --     Liver Function Tests: Recent Labs  Lab 04/25/20 1422 04/26/20 0548  AST 16 14*  ALT 10 10  ALKPHOS 153* 137*  BILITOT 0.5 0.2*  PROT 5.8* 5.3*  ALBUMIN 2.8* 2.5*   No results for input(s): LIPASE, AMYLASE in the last 168 hours. No results for input(s): AMMONIA in the last 168 hours.  ABG    Component Value Date/Time   PHART 7.468 (H) 04/26/2020 2220   PCO2ART 31.9 (L) 04/26/2020 2220   PO2ART 148 (H) 04/26/2020 2220   HCO3 24.5 04/26/2020 2220   TCO2 30 04/25/2020 1439   ACIDBASEDEF 0.5 04/26/2020 2220   O2SAT 99.2 04/26/2020 2220     Coagulation Profile: Recent Labs  Lab 04/25/20 1422  INR 1.0    Cardiac Enzymes: No results for input(s): CKTOTAL, CKMB, CKMBINDEX, TROPONINI in the last 168 hours.  HbA1C: No results found for: HGBA1C  CBG: Recent Labs  Lab 04/26/20 1113 04/26/20 1201 04/26/20 2111 04/27/20 0009 04/27/20 0315  GLUCAP 70 68* 160* 160* 155*    Review of Systems:   Unable to obtain due to encephalopathy and sedation/intubation  Past Medical History  She,  has a past medical history of Cancer ([place]), Liver disease, Multiple sclerosis ([place]), and Vision abnormalities.   Social History   reports that she has been smoking cigarettes. She has never used smokeless tobacco. She reports that she does not drink alcohol and does not use  drugs.   Family History   Her family history includes Bone cancer in her father; Diabetes in her mother.   Allergies Allergies  Allergen Reactions  . Penicillins Swelling    Swell rash     Home Medications  Prior to Admission medications   Medication Sig Start Date End Date Taking? Authorizing Provider  baclofen (LIORESAL) 10 MG tablet Take 10 mg by mouth 2 (two) times daily.  03/20/16  Yes [provider]  calcium citrate-vitamin D (CITRACAL+D) 315-200 MG-UNIT tablet Take 1 tablet by mouth 2 (two) times daily.   Yes [provider]  Cholecalciferol (VITAMIN D3) 3000 units TABS Take 2,000 Units by mouth daily.  08/17/13  Yes [provider]  Fingolimod HCl (GILENYA) 0.5 MG CAPS Take 0.5 mg by mouth daily.  01/08/17  Yes [provider]  ibuprofen (ADVIL) 400  MG tablet Take 400 mg by mouth every 4 (four) hours as needed for moderate pain.   Yes [provider]  oxyCODONE (OXY IR/ROXICODONE) 5 MG immediate release tablet Take 5 mg by mouth every 4 (four) hours as needed for severe pain.  03/21/20  Yes [provider]  potassium chloride (KLOR-CON) 10 MEQ tablet Take 20 mEq by mouth daily. 04/18/20  Yes [provider]  venlafaxine XR (EFFEXOR-XR) 37.5 MG 24 hr capsule Take 75 mg by mouth daily with breakfast.  03/20/16  Yes [provider]  phentermine 30 MG capsule Take 1 capsule (30 mg total) by mouth every morning. Patient not taking: Reported on 04/26/2020 02/05/17   Britt Bottom, MD     Critical care time: The patient is critically ill with multiple organ systems failure and requires high complexity decision making for assessment and support, frequent evaluation and titration of therapies, application of advanced monitoring technologies and extensive interpretation of multiple databases.   Critical Care Time devoted to patient care services described in this note is  45 Minutes. This time reflects time of care of  this signee. This critical care time does not reflect procedure time, or teaching time or supervisory time of PA/NP/Med student/Med Resident etc but could involve care discussion time.  Jacques Earthly, M.D. [facility] Pulmonary/Critical Care Medicine After hours pager: [phone].

## 2020-04-28 ENCOUNTER — Inpatient Hospital Stay (HOSPITAL_COMMUNITY): Payer: Medicare Other

## 2020-04-28 ENCOUNTER — Other Ambulatory Visit: Payer: Self-pay

## 2020-04-28 ENCOUNTER — Encounter (HOSPITAL_COMMUNITY): Payer: Self-pay | Admitting: Pulmonary Disease

## 2020-04-28 LAB — CBC
HCT: 30.8 % — ABNORMAL LOW (ref 36.0–46.0)
Hemoglobin: 9.8 g/dL — ABNORMAL LOW (ref 12.0–15.0)
MCH: 30.2 pg (ref 26.0–34.0)
MCHC: 31.8 g/dL (ref 30.0–36.0)
MCV: 94.8 fL (ref 80.0–100.0)
Platelets: 141 10*3/uL — ABNORMAL LOW (ref 150–400)
RBC: 3.25 MIL/uL — ABNORMAL LOW (ref 3.87–5.11)
RDW: 16.3 % — ABNORMAL HIGH (ref 11.5–15.5)
WBC: 11.1 10*3/uL — ABNORMAL HIGH (ref 4.0–10.5)
nRBC: 0 % (ref 0.0–0.2)

## 2020-04-28 LAB — GLUCOSE, CAPILLARY
Glucose-Capillary: 117 mg/dL — ABNORMAL HIGH (ref 70–99)
Glucose-Capillary: 147 mg/dL — ABNORMAL HIGH (ref 70–99)
Glucose-Capillary: 148 mg/dL — ABNORMAL HIGH (ref 70–99)
Glucose-Capillary: 154 mg/dL — ABNORMAL HIGH (ref 70–99)
Glucose-Capillary: 158 mg/dL — ABNORMAL HIGH (ref 70–99)
Glucose-Capillary: 173 mg/dL — ABNORMAL HIGH (ref 70–99)

## 2020-04-28 LAB — CK TOTAL AND CKMB (NOT AT ARMC)
CK, MB: 1.3 ng/mL (ref 0.5–5.0)
Relative Index: INVALID (ref 0.0–2.5)
Total CK: 87 U/L (ref 38–234)

## 2020-04-28 LAB — COMPREHENSIVE METABOLIC PANEL
ALT: 12 U/L (ref 0–44)
AST: 16 U/L (ref 15–41)
Albumin: 2.3 g/dL — ABNORMAL LOW (ref 3.5–5.0)
Alkaline Phosphatase: 131 U/L — ABNORMAL HIGH (ref 38–126)
Anion gap: 6 (ref 5–15)
BUN: 13 mg/dL (ref 8–23)
CO2: 23 mmol/L (ref 22–32)
Calcium: 8 mg/dL — ABNORMAL LOW (ref 8.9–10.3)
Chloride: 108 mmol/L (ref 98–111)
Creatinine, Ser: 0.37 mg/dL — ABNORMAL LOW (ref 0.44–1.00)
GFR calc Af Amer: >60 mL/min (ref >60)
GFR calc non Af Amer: >60 mL/min (ref >60)
Glucose, Bld: 143 mg/dL — ABNORMAL HIGH (ref 70–99)
Potassium: 3.9 mmol/L (ref 3.5–5.1)
Sodium: 137 mmol/L (ref 135–145)
Total Bilirubin: 0.3 mg/dL (ref 0.3–1.2)
Total Protein: 5.6 g/dL — ABNORMAL LOW (ref 6.5–8.1)

## 2020-04-28 LAB — MAGNESIUM: Magnesium: 2.3 mg/dL (ref 1.7–2.4)

## 2020-04-28 LAB — PROCALCITONIN: Procalcitonin: 0.23 ng/mL

## 2020-04-28 LAB — LACTIC ACID, PLASMA: Lactic Acid, Venous: 1.7 mmol/L (ref 0.5–1.9)

## 2020-04-28 LAB — TROPONIN I (HIGH SENSITIVITY): Troponin I (High Sensitivity): 4 ng/L (ref <18)

## 2020-04-28 LAB — PHOSPHORUS: Phosphorus: 2.4 mg/dL — ABNORMAL LOW (ref 2.5–4.6)

## 2020-04-28 LAB — TRIGLYCERIDES: Triglycerides: 90 mg/dL (ref <150)

## 2020-04-28 MED ORDER — LORAZEPAM 2 MG/ML IJ SOLN: 2.0000 mg | INTRAMUSCULAR | Status: DC | PRN

## 2020-04-28 MED ORDER — MORPHINE SULFATE (PF) 2 MG/ML IV SOLN: 2.0000 mg | INTRAVENOUS | Status: DC | PRN

## 2020-04-28 MED ORDER — DEXMEDETOMIDINE HCL IN NACL 200 MCG/50ML IV SOLN
0.4000 ug/kg/h | INTRAVENOUS | Status: DC
  Administered 2020-04-28: 0.4 ug/kg/h via INTRAVENOUS
  Administered 2020-04-28: 0.6 ug/kg/h via INTRAVENOUS
  Filled 2020-04-28 (×2): qty 50

## 2020-04-28 MED ORDER — POTASSIUM PHOSPHATES 15 MMOLE/5ML IV SOLN
10.0000 mmol | Freq: Once | INTRAVENOUS | Status: AC
  Administered 2020-04-28: 10 mmol via INTRAVENOUS
  Filled 2020-04-28: qty 3.33

## 2020-04-28 MED ORDER — FENTANYL CITRATE (PF) 100 MCG/2ML IJ SOLN: 25.0000 ug | INTRAMUSCULAR | Status: DC | PRN

## 2020-04-28 NOTE — Progress Notes (Signed)
PCCM progress note  Rerounded on patient this afternoon.  Per RT patient has tolerated SBT well.  Discussion was held with daughter at bedside and decision was made to perform one-way extubation given known history of incurable metastatic adenocarcinoma.  Daughter reports patients husband has been updated and is agreeable to plan as well.  Daughter was instructed to notify husband that he is able to be present at bedside before extubation occurs.  Informed the daughter that if patient develops any kind of respiratory distress or discomfort post extubation we will implement comfort measures to ensure patient is comfortable.  Depending on patient's clinical course may need inpatient versus outpatient hospice consultation.  Order placed for extubation  Johnsie Cancel, NP-C [facility] Contact / Pager information can be found on Amion  04/28/2020, 4:54 PM

## 2020-04-28 NOTE — Procedures (Signed)
Extubation Procedure Note  Patient Details:   Name: Michele Sawyer DOB: 06-25-1961 MRN: [id number]   Airway Documentation:    Vent end date: 04/28/20 Vent end time: 1716   Evaluation  O2 sats: stable throughout Complications: No apparent complications Patient did tolerate procedure well. Bilateral Breath Sounds: Diminished   Yes   Pt extubated per MD order and able to speak. Positive cuff leak heard prior to extubation.   Esperanza Sheets T 04/28/2020, 5:18 PM

## 2020-04-28 NOTE — Progress Notes (Signed)
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  Unable to obtain history from patient due to encephalopathy, intubation. In the ED, she was intubated for airway protection. She received Keppra, Ativan. She was given 81m of Decadron. She was started on a propofol gtt. 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   has a past surgical history that includes Partial hysterectomy and Eye surgery (Left).  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 WPablo Ledgerin radiologyand Dr. WRedmond Pullingin orthopedics.It is thought to be of GI primary therefore patient was started on FOLFOX therapywith 2 days on at-home 5-FU pump.Starting with treatmenton 01/25/2020, she will be on dose-adjusted therapyat 75% of original dosebecause of her blood counts.She started radiation therapy on 02/19/2020. .Marland KitchenHer ECOG performance status is 2.  PET SCAn 8/18 IMPRESSION:  1. Moderate progression of widespread metastasis as detailed above.  New and increased hypermetabolic lung lesions, a new focus of right  hepatic lobe hypermetabolism (suspicious for metastasis) and  enlargement of a right adrenal metastasis.  2. New and progressive subcutaneous and intramuscular  lesions as  detailed above.  3. Right acetabular and proximal femoral metastasis, with new  cortical involvement and pathologic fracture involving the  weight-bearing surface of the right acetabulum.  4. Incidental findings, including: Aortic atherosclerosis  (ICD10-I70.0), coronary artery atherosclerosis and emphysema  (ICD10-J43.9).     Significant Hospital Events   8/26 >Intubation 8/28 >[facility] No seizures per RN but agitated on diprivan. Spot EEG today - still pending. MRI - pending. Has OG tube. Not on pressors.   Consults:  Neuro 8/26  Procedures:  ETT 8/26  Significant Diagnostic Tests:  CT head w/o contrast 8/26> 1 cm lesion of the parasagittal left parietal lobe with surrounding edema and minor mass effect.  CXR 8/26> Left upper lobe nodule corresponds to nodule on PET. Known right infrahilar nodule not well seen by radiograph  Micro Data:  COVID19 8/26 > neg BCx x2 8/19 > Resp Cx 8/28 >   Antimicrobials:  vanc 8/27>8/27 Cefepime 8/27>  Interim history/subjective:   04/28/2020 -  MRI brain yesterday with diffuse at least 6 brain mets with cluster in left parietal area and some with hemorrhage. No midline shift. 1 infarct in cerebellum. EEG yesterday with severe encephalopathy. Dr EMarin Olpd/w huband -> patient is now DNR. Advocating comfort care. Very poor prognosis. This AM - > still on diprivan but doing SBT  Objective   Blood pressure (!) 146/61, pulse 71, temperature 98.1 F (36.7 C), temperature source Axillary, resp. rate 17, height 5' 7"  (1.702 m), weight 57.5 kg, SpO2 100 %.    Vent Mode: PRVC FiO2 (%):  [30 %-40 %] 40 % Set Rate:  [14 bmp] 14 bmp Vt Set:  [  490 mL] 490 mL PEEP:  [5 cmH20] 5 cmH20 Pressure Support:  [8 cmH20] 8 cmH20 Plateau Pressure:  [13 cmH20-17 cmH20] 13 cmH20   Intake/Output Summary (Last 24 hours) at 04/28/2020 0853 Last data filed at 04/28/2020 0600 Gross per 24 hour  Intake  1638.92 ml  Output 900 ml  Net 738.92 ml   Filed Weights   04/27/20 0007 04/27/20 0500 04/28/20 0500  Weight: 57.4 kg 57.7 kg 57.5 kg    General Appearance:  Looks criticall ill Head:  Normocephalic, without obvious abnormality, atraumatic Eyes:  PERRL - yes, conjunctiva/corneas - muddy     Ears:  Normal external ear canals, both ears Nose:  G tube - no Throat:  ETT TUBE - yes , OG tube - yes with TF Neck:  Supple,  No enlargement/tenderness/nodules Lungs: Clear to auscultation bilaterally, Ventilator   Synchrony - yes Heart:  S1 and S2 normal, no murmur, CVP - no.  Pressors - no Abdomen:  Soft, no masses, no organomegaly Genitalia / Rectal:  Not done Extremities:  Extremities- itnact Skin:  ntact in exposed areas . Sacral area - not examined Neurologic:  Sedation - diprivan gtt -> RASS - -4 . Moves all 4s - yes weakly. CAM-ICU - cannot teste . Orientation - not oriented. 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 A new soft tissue bippsy around pelvis was pending per husband. [facility] with multiple brain mets with some hemorrhage and cerbellar infarct   04/28/2020 - terminal prognosis per Dr Marin Olp   Plan  - move towards palliation    PULMONARY  A:  Acute Resp failure due to encephalopathy and seizures due to brain met   04/28/2020 - > does yes meet criteria for SBT and doing SBT but might not meet criteria for medical Extubation in setting of Acute  Respiratory Failure due to acute encephalopathy    P:   SBT off diprivan -> asess for medical extubation esp if awake -> will give opportunity for her to spend time with family awake If above not possible, then discuss with family about palliative extubation PRVC otherwise VAP bundle   NEUROLOGIC A:   Baesline: multiple sclerpsis  - on gilenya Other home CNS meds -  > effexor, opiopids for cancer pain, baclofen, phentermine  Admit:  Status epilepticus, Cancer metastasis to brain on MRI  Repeat EEG 8/28 - no seizures. No encephalopathy  8/29 - no visible seizures  P:   - assess mental status  Off diprivan ---Continue Keppra and Vimpat --Continue dexamethasone - cotninue home effexor for now    VASCULAR A:   AT risk for circulatory shock  04/28/2020 - not on pressors  P:  MAP goal > 65  CARDIAC STRUCTURAL A: Ruled out cardiomyopathy - ef normal 8/28 and trop normal  P: monitor  CARDIAC ELECTRICAL A: At risk for A Fib   P: tele  INFECTIOUS A:   SIRS - : Elevated WBC and fever 105F . MRSA  PCR screening neg. Sepsis being ruled out   04/28/2020 - afebrile. PCT low/normal  P:   --Follow up blood and respiratory cultures --Discontinue vanc.  - DisContinue cefepime  RENAL A:  At risk for AKI P:  Fluids  ELECTROLYTES A:  Mild low phos   P: Replete phos   GASTROINTESTINAL A:   Has OG - gettint TF  P:   TF for now  HEMATOLOGIC   - HEME A:  Chronic anemia: Hgb at baseline of around 10.5  04/28/2020 - no overg bleeding  P:  - PRBC for hgb </= 6.9gm%    - exceptions are   -  if ACS susepcted/confirmed then transfuse for hgb </= 8.0gm%,  or    -  active bleeding with hemodynamic instability, then transfuse regardless of hemoglobin value   At at all times try to transfuse 1 unit prbc as possible with exception of active hemorrhage   ENDOCRINE A:   At risk hypo and hyperglycemia    P:   SSI    Best practice:  Diet: NPO but  consider TF Pain/Anxiety/Delirium protocol (if indicated): fent prn and prop gtt and versed prn VAP protocol (if indicated): Ordered DVT prophylaxis: SCDs GI prophylaxis: H2B Glucose control: Monitor Mobility: Bed rest  Code Status: FULL . -  8/28 - Husband says no MD has told that patient's cancer is "hopeless" and so till then he is looking for Rx to fight cancer. He says he does not want to hear ever if is hopeless and says he will deal with his emotions then.   Family Communication: No family at bedside. Husband Valina Maes 04/27/20 - says he is worried that she is on ventilator -> explained risk for her contagion with covid in hospital and for him as visitior -> would be very rare events. He is very concerned about ventilator -> a) explained ventilator is marker of severe illness; b) figuring out illness and prognosis and giving [place] time; c) explained risks of being on ventilator - VILI, VAP etc., ;d ) explained MRI and spot EEG today; e) explained no beds at [facility] for c-EEG; f) explained will get local Onc consultation; g)   04/28/2020 - patient now DNR after d/w Dr Arelia Sneddon oncologist. . Dr Marin Olp advising comfort care.  Spoke to husband -> he said "yesterday was the worst day of his life to hear wife's prognosis". Says his emotions are coming and going with heart pangs. Husband says patient's biologic siste in [place] and daughter in [place] have been updated abut patient condition. Husband impression is that patietn life expectacny is < 2 weeks. Husband in alignment about DNR and ensure comfort. He does not want her in pain  I discussed extubation-> husband is of undertsanding at this point ventilator is providing comfort. Long discussion that comfort can be provided without ventilator and that ventilator as a Rx is no longer beneficial. Explained that we can give her another 24h or so to see if encephalopathy would reverse such that  Medical extubation is possible and patietn can conversed. If not,  recommended palliative extubation and terminal care. He needed some time and space to think about thse concepts esp that palliation without mech vent is possible. He is sad that his wife life expectancy is very short    ATTESTATION & SIGNATURE   The patient HOUDA BRAU is critically ill with multiple organ systems failure and requires high complexity decision making for assessment and support, frequent evaluation and titration of  therapies, application of advanced monitoring technologies and extensive interpretation of multiple databases.   Critical Care Time devoted to patient care services described in this note is  45  Minutes. This time reflects time of care of this signee Dr Brand Males. This critical care time does not reflect procedure time, or teaching time or supervisory time of PA/NP/Med student/Med Resident etc but could involve care discussion time     Dr. Brand Males, M.D., [facility].C.P Pulmonary and Critical Care Medicine Staff Physician [facility] Pager: [phone], If no answer or between  15:00h - 7:00h: call [phone]  04/28/2020 9:13 AM    LABS    PULMONARY Recent Labs  Lab 04/25/20 1439 04/26/20 2220 04/27/20 0410  PHART  --  7.468* 7.420  PCO2ART  --  31.9* 35.7  PO2ART  --  148* 103  HCO3  --  24.5 22.7  TCO2 30  --   --   O2SAT  --  99.2 98.1    CBC Recent Labs  Lab 04/26/20 0548 04/27/20 0313 04/28/20 0435  HGB 9.6* 10.9* 9.8*  HCT 30.4* 34.8* 30.8*  WBC 6.5 14.6* 11.1*  PLT 226 177 141*    COAGULATION Recent Labs  Lab 04/25/20 1422  INR 1.0    CARDIAC  No results for input(s): TROPONINI in the last 168 hours. No results for input(s): PROBNP in the last 168 hours.   CHEMISTRY Recent Labs  Lab 04/25/20 1422 04/25/20 1422 04/25/20 1439 04/25/20 1439 04/26/20 0548 04/26/20 0548 04/27/20 0313 04/28/20 0435  NA 138  --  142  --  139  --  139 137  K 3.0*   < > 3.6    < > 3.1*   < > 3.6 3.9  CL 104  --  101  --  109  --  108 108  CO2 24  --   --   --  23  --  21* 23  GLUCOSE 107*  --  101*  --  95  --  165* 143*  BUN 12  --  12  --  12  --  12 13  CREATININE 0.40*  --  0.40*  --  0.39*  --  0.46 0.37*  CALCIUM 8.2*  --   --   --  8.2*  --  8.2* 8.0*  MG  --   --   --   --   --   --  1.8 2.3  PHOS  --   --   --   --   --   --  3.3 2.4*   < > = values in this interval not displayed.   Estimated Creatinine Clearance: 67 mL/min (A) (by C-G formula based on SCr of 0.37 mg/dL (L)).   LIVER Recent Labs  Lab 04/25/20 1422 04/26/20 0548 04/28/20 0435  AST 16 14* 16  ALT 10 10 12   ALKPHOS 153* 137* 131*  BILITOT 0.5 0.2* 0.3  PROT 5.8* 5.3* 5.6*  ALBUMIN 2.8* 2.5* 2.3*  INR 1.0  --   --      INFECTIOUS Recent Labs  Lab 04/26/20 1423 04/26/20 1607 04/26/20 1802 04/28/20 0435  LATICACIDVEN  --  2.1* 1.3 1.7  PROCALCITON <0.10  --   --  0.23     ENDOCRINE CBG (last 3)  Recent Labs    04/28/20 0009 04/28/20 0345 04/28/20 0801  GLUCAP 148* 154* 158*  IMAGING x48h  - image(s) personally visualized  -   highlighted in bold EEG  Result Date: 04/27/2020 Lora Havens, MD     04/27/2020  6:32 PM MRN: [id number] Epilepsy Attending: Lora Havens Referring Physician/Provider: Dr Heath Lark Date: 04/27/2020 Duration:  23.37 mins  Patient history: 59yo F with seizures. EEG to evaluate for seizure.  Level of alertness:  comatose  AEDs during EEG study: LEV  Technical aspects: This EEG study was done with scalp electrodes positioned according to the 10-20 International system of electrode placement. Electrical activity was acquired at a sampling rate of 500Hz  and reviewed with a high frequency filter of 70Hz  and a low frequency filter of 1Hz . EEG data were recorded continuously and digitally stored.  Description:  EEG showed continuous generalized 3 to 6 Hz theta-delta slowing.  Hyperventilation and photic stimulation were not  performed.    ABNORMALITY -Continuous slow, generalized IMPRESSION: This study is suggestive of severe diffuse encephalopathy non specific etiology. No seizures and epileptiform discharges were seen throughout the recording. EEG appears improved compared to previous day.   Lora Havens   DG Chest 1 View  Result Date: 04/26/2020 CLINICAL DATA:  Fever.  Hypoxia EXAM: CHEST  1 VIEW COMPARISON:  April 25, 2020 chest radiograph; PET-CT April 17, 2020 FINDINGS: Endotracheal tube tip is 2.9 cm above the carina. Nasogastric tube tip and side port are below the diaphragm. Central catheter tip is in the superior vena cava. No pneumothorax. Ill-defined nodular opacity in the left upper lobe is partially obscured by monitor lead. No edema or airspace opacity. Heart size and pulmonary vascularity are normal. No adenopathy. There are calcified breast implants bilaterally. IMPRESSION: Tube and catheter positions as described without pneumothorax. Apparent mass in the left upper lobe is not well seen on this study, in part due to overlapping of monitor lead. No edema or airspace opacity. Stable cardiac silhouette. Electronically Signed   By: Lowella Grip III M.D.   On: 04/26/2020 14:44   DG Abd 1 View  Result Date: 04/27/2020 CLINICAL DATA:  Evaluate OG tube placement EXAM: ABDOMEN - 1 VIEW COMPARISON:  None. FINDINGS: The OG tube terminates in the lower lateral left abdomen, likely in the distal gastric body. IMPRESSION: The NG tube terminates in the stomach, probably the distal body. Electronically Signed   By: Dorise Bullion III M.D   On: 04/27/2020 15:59   MR Brain W and Wo Contrast  Result Date: 04/27/2020 CLINICAL DATA:  Seizure. History of metastatic adenocarcinoma. Abnormal CT head. EXAM: MRI HEAD WITHOUT AND WITH CONTRAST TECHNIQUE: Multiplanar, multiecho pulse sequences of the brain and surrounding structures were obtained without and with intravenous contrast. CONTRAST:  6 mL Gadovist IV  COMPARISON:  CT head 04/25/2020 FINDINGS: Brain: Multiple metastatic deposits in the brain 7 mm metastatic deposit left lower occipital lobe 14 mm metastatic deposit left medial occipital lobe with evidence of prior hemorrhage. 18 mm left parietal ring-enhancing metastatic deposit with surrounding edema and local mass-effect with evidence of mild prior hemorrhage. 10.5 mm metastatic deposit high left parietal lobe with surrounding edema with evidence of mild prior hemorrhage. 4 mm metastatic deposit right posterior parietal lobe 11 mm metastatic deposit right medial frontal lobe with surrounding edema. Generalized atrophy.  No midline shift.  Negative for hydrocephalus. Chronic microvascular ischemic changes in the white matter bilaterally. 3 x 10 mm acute infarct left posterior cerebellum with adjacent chronic infarct in the left cerebellum. Vascular: Normal arterial flow voids. Skull and upper cervical  spine: No focal skeletal lesions. Sinuses/Orbits: Patient is intubated with the fluid level in the posterior nasopharynx. Mild mucosal edema paranasal sinuses. Mastoid clear. Negative orbit Other: None IMPRESSION: At least 6 metastatic deposits in the brain. There is a cluster metastatic deposits in the left parietal lobe with edema. Several lesions show evidence of prior hemorrhage. No midline shift. Generalized atrophy with chronic microvascular ischemic change Small acute infarct left cerebellum These results were called by telephone at the time of interpretation on 04/27/2020 at 3:27 pm to provider [facility], who verbally acknowledged these results. Electronically Signed   By: Franchot Gallo M.D.   On: 04/27/2020 15:28   DG CHEST PORT 1 VIEW  Result Date: 04/28/2020 CLINICAL DATA:  Status post intubation. EXAM: PORTABLE CHEST 1 VIEW COMPARISON:  04/27/2020. FINDINGS: The endotracheal tube tip is above the carina. Right chest wall port a catheter tip is at the cavoatrial junction. Nasogastric tube stable in  position. Normal cardiomediastinal contours. No pleural effusion identified. No airspace consolidation. Left upper lobe lung nodule is obscured by overlying support apparatus. IMPRESSION: ET tube tip is in satisfactory position above the carina. Electronically Signed   By: Kerby Moors M.D.   On: 04/28/2020 08:20   DG Chest Port 1 View  Result Date: 04/27/2020 CLINICAL DATA:  Intubated, short of breath EXAM: PORTABLE CHEST 1 VIEW COMPARISON:  04/26/2020, 04/25/2020 FINDINGS: Endotracheal tube tip is about 2.3 cm superior to the carina. Esophageal tube tip is below the diaphragm but incompletely visualized. Right-sided central venous port tip over the SVC. Ill-defined left upper lobe lung nodule. Streaky atelectasis at the left base. Stable cardiomediastinal silhouette. No pneumothorax. IMPRESSION: Endotracheal tube tip about 2.3 cm superior to the carina. Ill-defined left upper lobe lung nodule as before. Streaky atelectasis at the left base Electronically Signed   By: Donavan Foil M.D.   On: 04/27/2020 03:29   EEG adult  Result Date: 04/26/2020 Lora Havens, MD     04/26/2020  6:31 PM Patient Name: ARYONA SILL MRN: [id number] Epilepsy Attending: Lora Havens Referring Physician/Provider: Dr Heath Lark Date: 04/26/2020 Duration: 29.40 mins Patient history: 59yo F with seizures. EEG to evaluate for seizure. Level of alertness:  comatose AEDs during EEG study: LEV, Propofol Technical aspects: This EEG study was done with scalp electrodes positioned according to the 10-20 International system of electrode placement. Electrical activity was acquired at a sampling rate of 500Hz  and reviewed with a high frequency filter of 70Hz  and a low frequency filter of 1Hz . EEG data were recorded continuously and digitally stored. Description:  EEG showed continuous generalized 3 to 6 Hz theta-delta slowing. 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  Hyperventilation  and photic stimulation were not performed.   ABNORMALITY -Continuous slow, generalized -Periodic epileptiform discharges, lateralized left hemisphere IMPRESSION: This study showed evidence of epileptogenicity arising from left hemisphere due to underlying structural abnormality. Additionally there is evidence of severe diffuse encephalopathy non specific etiology but likely secondary to sedation. No seizures were seen throughout the recording. Lora Havens   ECHOCARDIOGRAM COMPLETE  Result Date: 04/27/2020    ECHOCARDIOGRAM REPORT   Patient Name:   STARLETTA HOUCHIN Date of Exam: 04/27/2020 Medical Rec #:  [id number]     Height:       67.0 in Accession #:    [id number]    Weight:       127.2 lb Date of Birth:  1960/12/23     BSA:  1.668 m Patient Age:    22 years      BP:           123/57 mmHg Patient Gender: F             HR:           70 bpm. Exam Location:  Inpatient Procedure: 2D Echo, Color Doppler and Cardiac Doppler Indications:    Acute Respiratory Insufficiency R06.89;  History:        Patient has no prior history of Echocardiogram examinations.  Sonographer:    Raquel Sarna Senior RDCS Referring Phys: [phone] [facility]  Sonographer Comments: Technically difficult due to breast implants. Supine on artifiical respirator. IMPRESSIONS  1. Left ventricular ejection fraction, by estimation, is 55 to 60%. The left ventricle has normal function. Left ventricular endocardial border not optimally defined to evaluate regional wall motion. Left ventricular diastolic parameters are indeterminate.  2. RV-RA gradient normal at 23 mmHg, unable to estimate CVP. Right ventricular systolic function is normal. The right ventricular size is normal.  3. The mitral valve is grossly normal. Trivial mitral valve regurgitation.  4. The aortic valve was not well visualized. Aortic valve regurgitation is not visualized. FINDINGS  Left Ventricle: Left ventricular ejection fraction, by estimation, is 55 to 60%. The left  ventricle has normal function. Left ventricular endocardial border not optimally defined to evaluate regional wall motion. The left ventricular internal cavity size was normal in size. There is no left ventricular hypertrophy. Left ventricular diastolic parameters are indeterminate. Right Ventricle: RV-RA gradient normal at 23 mmHg, unable to estimate CVP. The right ventricular size is normal. No increase in right ventricular wall thickness. Right ventricular systolic function is normal. Left Atrium: Left atrial size was normal in size. Right Atrium: Right atrial size was normal in size. Pericardium: There is no evidence of pericardial effusion. Presence of pericardial fat pad. Mitral Valve: The mitral valve is grossly normal. Trivial mitral valve regurgitation. Tricuspid Valve: The tricuspid valve is grossly normal. Tricuspid valve regurgitation is trivial. Aortic Valve: The aortic valve was not well visualized. Aortic valve regurgitation is not visualized. Pulmonic Valve: The pulmonic valve was not well visualized. Pulmonic valve regurgitation is not visualized. Aorta: The aortic root is normal in size and structure. Venous: IVC assessment for right atrial pressure unable to be performed due to mechanical ventilation. IAS/Shunts: No atrial level shunt detected by color flow Doppler.  LEFT VENTRICLE PLAX 2D LVIDd:         3.60 cm  Diastology LVIDs:         2.60 cm  LV e' lateral:   8.81 cm/s LV PW:         0.70 cm  LV E/e' lateral: 8.8 LV IVS:        0.70 cm  LV e' medial:    5.44 cm/s LVOT diam:     2.00 cm  LV E/e' medial:  14.2 LV SV:         65 LV SV Index:   39 LVOT Area:     3.14 cm  RIGHT VENTRICLE RV S prime:     10.40 cm/s TAPSE (M-mode): 1.8 cm LEFT ATRIUM             Index       RIGHT ATRIUM           Index LA diam:        3.10 cm 1.86 cm/m  RA Area:     17.70 cm LA Vol (  A2C):   39.0 ml 23.38 ml/m RA Volume:   49.00 ml  29.37 ml/m LA Vol (A4C):   34.2 ml 20.50 ml/m LA Biplane Vol: 37.0 ml 22.18  ml/m  AORTIC VALVE LVOT Vmax:   84.20 cm/s LVOT Vmean:  56.200 cm/s LVOT VTI:    0.206 m  AORTA Ao Root diam: 2.90 cm MITRAL VALVE               TRICUSPID VALVE MV Area (PHT): 3.85 cm    TR Peak grad:   23.0 mmHg MV Decel Time: 197 msec    TR Vmax:        240.00 cm/s MV E velocity: 77.10 cm/s MV A velocity: 45.40 cm/s  SHUNTS MV E/A ratio:  1.70        Systemic VTI:  0.21 m                            Systemic Diam: 2.00 cm Rozann Lesches MD Electronically signed by Rozann Lesches MD Signature Date/Time: 04/27/2020/4:11:40 PM    Final

## 2020-04-28 NOTE — Progress Notes (Addendum)
   Repeat bedside rounds at this point in time she is extubated.  She is actually looking deconditioned.  She is slow to respond but she knows where she is.  She said hello.  She recognized me as the physician.  Her biological daughter is next to her and she recognized her.  She says she is doing better.  Daughter is in tears of joy.  At this point in time according to nurse practitioner and the daughter confirmed the goal is palliation and terminal care  I discussed with nurse practitioner-patient will be transferred to the palliative care floor with hospitalist to pick up the patient tomorrow  If patient maintains alertness then goal will be to discharge home with home hospice versus general inpatient hospice  If patient declines then morphine for palliation can be instituted  For now  =-Bedrest -Continue Decadron and antiseizure medications -Fentanyl as needed and Versed as needed as needed -for comfort -Once more alert can eat as tolerated -No need to check daily labs -Can continue maintenance dextrose -When more alert can have comfort foods     SIGNATURE    Dr. Brand Males, M.D., F.C.C.P,  Pulmonary and Critical Care Medicine Staff Physician, [place] Director - Interstitial Lung Disease  Program  Pulmonary [place] [facility] [place], [place], [address]  Pager: [phone], If no answer  OR between  19:00-7:00h: page [phone]  [phone] Telephone (clinical office): [phone] Telephone (research): [phone]  5:52 PM 04/28/2020

## 2020-04-28 NOTE — Progress Notes (Signed)
Michele Sawyer is still sedated.  Michele Sawyer is on propofol.  He does not sound like Michele Sawyer is on all that much.  Michele Sawyer is still intubated.  Hopefully, Michele Sawyer will be extubated today.  Michele Sawyer is still being fed.  I am not sure this really is going to be of benefit for Michele Sawyer.  Michele Sawyer appears comfortable.  Michele Sawyer labs show Michele Sawyer albumin to be 2.3.  Michele Sawyer creatinine is 0.37.  Michele Sawyer white cell count is 11.1.  Hemoglobin 9.8.  Platelet count 141,000.  Michele Sawyer has this aggressive malignancy.  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  Michele Sawyer had the EEG yesterday.  Michele Sawyer has severe encephalopathy.  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  Michele Sawyer vital signs all are stable right now.  Temperature 98.1.  Pulse 80.  Blood pressure 123/66.  Overall, there really is no change in Michele Sawyer physical exam.  Michele Sawyer lungs sound decent bilaterally.  There may be a couple expiratory wheezes.  Cardiac exam regular rate and rhythm.  Abdomen is soft.  Michele Sawyer has decent bowel sounds.  Again, Michele Sawyer is just so unfortunate to have this aggressive malignancy.  Despite chemotherapy, it is progressed.  Michele Sawyer has the multiple brain mets.  Michele Sawyer is not a candidate for additional therapy given Michele Sawyer overall debilitated state.  We will have to see how Michele Sawyer does when Michele Sawyer is extubated.  Michele Sawyer might need a low-dose morphine infusion for comfort.  I very much appreciate the wonderful care Michele Sawyer is getting from the staff in the ICU.  It is no surprise professional everyone is and how compassionate everyone is in the ICU.  Lattie Haw, MD  2 Corinthians 5:7

## 2020-04-29 DIAGNOSIS — Z7189 Other specified counseling: Secondary | ICD-10-CM

## 2020-04-29 DIAGNOSIS — Z515 Encounter for palliative care: Secondary | ICD-10-CM

## 2020-04-29 DIAGNOSIS — R569 Unspecified convulsions: Secondary | ICD-10-CM

## 2020-04-29 LAB — CBC WITH DIFFERENTIAL/PLATELET
Abs Immature Granulocytes: 0.17 10*3/uL — ABNORMAL HIGH (ref 0.00–0.07)
Basophils Absolute: 0 10*3/uL (ref 0.0–0.1)
Basophils Relative: 0 %
Eosinophils Absolute: 0 10*3/uL (ref 0.0–0.5)
Eosinophils Relative: 0 %
HCT: 28.8 % — ABNORMAL LOW (ref 36.0–46.0)
Hemoglobin: 9.3 g/dL — ABNORMAL LOW (ref 12.0–15.0)
Immature Granulocytes: 2 %
Lymphocytes Relative: 2 %
Lymphs Abs: 0.2 10*3/uL — ABNORMAL LOW (ref 0.7–4.0)
MCH: 30.5 pg (ref 26.0–34.0)
MCHC: 32.3 g/dL (ref 30.0–36.0)
MCV: 94.4 fL (ref 80.0–100.0)
Monocytes Absolute: 0.4 10*3/uL (ref 0.1–1.0)
Monocytes Relative: 4 %
Neutro Abs: 8.5 10*3/uL — ABNORMAL HIGH (ref 1.7–7.7)
Neutrophils Relative %: 92 %
Platelets: 147 10*3/uL — ABNORMAL LOW (ref 150–400)
RBC: 3.05 MIL/uL — ABNORMAL LOW (ref 3.87–5.11)
RDW: 16 % — ABNORMAL HIGH (ref 11.5–15.5)
WBC: 9.3 10*3/uL (ref 4.0–10.5)
nRBC: 0 % (ref 0.0–0.2)

## 2020-04-29 LAB — COMPREHENSIVE METABOLIC PANEL
ALT: 17 U/L (ref 0–44)
AST: 21 U/L (ref 15–41)
Albumin: 2.4 g/dL — ABNORMAL LOW (ref 3.5–5.0)
Alkaline Phosphatase: 131 U/L — ABNORMAL HIGH (ref 38–126)
Anion gap: 8 (ref 5–15)
BUN: 10 mg/dL (ref 8–23)
CO2: 25 mmol/L (ref 22–32)
Calcium: 8.1 mg/dL — ABNORMAL LOW (ref 8.9–10.3)
Chloride: 105 mmol/L (ref 98–111)
Creatinine, Ser: 0.4 mg/dL — ABNORMAL LOW (ref 0.44–1.00)
GFR calc Af Amer: >60 mL/min (ref >60)
GFR calc non Af Amer: >60 mL/min (ref >60)
Glucose, Bld: 120 mg/dL — ABNORMAL HIGH (ref 70–99)
Potassium: 3.5 mmol/L (ref 3.5–5.1)
Sodium: 138 mmol/L (ref 135–145)
Total Bilirubin: 0.5 mg/dL (ref 0.3–1.2)
Total Protein: 5.4 g/dL — ABNORMAL LOW (ref 6.5–8.1)

## 2020-04-29 LAB — GLUCOSE, CAPILLARY
Glucose-Capillary: 101 mg/dL — ABNORMAL HIGH (ref 70–99)
Glucose-Capillary: 103 mg/dL — ABNORMAL HIGH (ref 70–99)
Glucose-Capillary: 106 mg/dL — ABNORMAL HIGH (ref 70–99)
Glucose-Capillary: 109 mg/dL — ABNORMAL HIGH (ref 70–99)
Glucose-Capillary: 95 mg/dL (ref 70–99)

## 2020-04-29 LAB — CULTURE, RESPIRATORY W GRAM STAIN

## 2020-04-29 LAB — PREALBUMIN: Prealbumin: 14 mg/dL — ABNORMAL LOW (ref 18–38)

## 2020-04-29 MED ORDER — GLYCOPYRROLATE 0.2 MG/ML IJ SOLN: 0.2000 mg | INTRAMUSCULAR | Status: DC | PRN

## 2020-04-29 MED ORDER — ONDANSETRON HCL 4 MG/2ML IJ SOLN: 4.0000 mg | Freq: Four times a day (QID) | INTRAMUSCULAR | Status: DC | PRN

## 2020-04-29 MED ORDER — GLYCOPYRROLATE 1 MG PO TABS
1.0000 mg | ORAL_TABLET | ORAL | Status: DC | PRN
  Filled 2020-04-29: qty 1

## 2020-04-29 MED ORDER — HALOPERIDOL 0.5 MG PO TABS
0.5000 mg | ORAL_TABLET | ORAL | Status: DC | PRN
  Filled 2020-04-29: qty 1

## 2020-04-29 MED ORDER — HALOPERIDOL LACTATE 2 MG/ML PO CONC
0.5000 mg | ORAL | Status: DC | PRN
  Filled 2020-04-29: qty 0.3

## 2020-04-29 MED ORDER — POLYVINYL ALCOHOL 1.4 % OP SOLN
1.0000 [drp] | Freq: Four times a day (QID) | OPHTHALMIC | Status: DC | PRN
  Filled 2020-04-29: qty 15

## 2020-04-29 MED ORDER — ONDANSETRON 4 MG PO TBDP: 4.0000 mg | ORAL_TABLET | Freq: Four times a day (QID) | ORAL | Status: DC | PRN

## 2020-04-29 MED ORDER — HALOPERIDOL LACTATE 5 MG/ML IJ SOLN: 0.5000 mg | INTRAMUSCULAR | Status: DC | PRN

## 2020-04-29 MED ORDER — LORAZEPAM 0.5 MG PO TABS
0.5000 mg | ORAL_TABLET | Freq: Every day | ORAL | Status: DC
  Administered 2020-04-29: 0.5 mg via ORAL
  Filled 2020-04-29: qty 1

## 2020-04-29 MED ORDER — BIOTENE DRY MOUTH MT LIQD: 15.0000 mL | OROMUCOSAL | Status: DC | PRN

## 2020-04-29 NOTE — TOC Initial Note (Addendum)
Transition of Care [facility]) - Initial/Assessment Note    Patient Details  Name: Michele Sawyer MRN: [id number] Date of Birth: [birth year]  Transition of Care [facility]) CM/SW Contact:    Lennart Pall, LCSW Phone Number: 04/29/2020, 4:18 PM  Clinical Narrative:                 Met with pt and spoke with spouse after call from Florentina Jenny, [place] with Palliative Care Team.  Confirming with them their desire for dc to [place] - confirmed.  Have sent information to [facility] for review and hope to hear soon of bed availability. Continue to follow  Expected Discharge Plan: [place] Barriers to Discharge: Continued Medical Work up   Patient Goals and CMS Choice     Choice offered to / list presented to : Spouse  Expected Discharge Plan and Services Expected Discharge Plan: [place] In-house Referral: Clinical Social Work   Post Acute Care Choice: Hospice Living arrangements for the past 2 months: Single Family Home                 DME Arranged: N/A DME Agency: NA       HH Arranged: NA [place] Agency: NA        Prior Living Arrangements/Services Living arrangements for the past 2 months: [place] Lives with:: Spouse Patient language and need for interpreter reviewed:: No        Need for Family Participation in Patient Care: No (Comment) Care giver support system in place?: Yes (comment) Current home services: Hospice Criminal Activity/Legal Involvement Pertinent to Current Situation/Hospitalization: No - Comment as needed  Activities of Daily Living Home Assistive Devices/Equipment: None ADL Screening (condition at time of admission) Patient's cognitive ability adequate to safely complete daily activities?: No Is the patient deaf or have difficulty hearing?: No Does the patient have difficulty seeing, even when wearing glasses/contacts?: No Does the patient have difficulty concentrating, remembering, or making decisions?:  No Patient able to express need for assistance with ADLs?: Yes Does the patient have difficulty dressing or bathing?: No Independently performs ADLs?: Yes (appropriate for developmental age) Does the patient have difficulty walking or climbing stairs?: No Weakness of Legs: Both Weakness of Arms/Hands: Both  Permission Sought/Granted Permission sought to share information with : Family Supports Permission granted to share information with : Yes, Verbal Permission Granted  Share Information with NAME: Danie Hannig     Permission granted to share info w Relationship: spouse     Emotional Assessment Appearance:: Appears older than stated age Attitude/Demeanor/Rapport: Gracious Affect (typically observed): Accepting, Quiet Orientation: : Oriented to Self, Oriented to Place, Oriented to Situation Alcohol / Substance Use: Not Applicable Psych Involvement: No (comment)  Admission diagnosis:  Status epilepticus ([place]) [G40.901] Seizure ([place]) [R56.9] Brain metastasis (HCC) [C79.31] Fever [R50.9] Patient Active Problem List   Diagnosis Date Noted  . Palliative care encounter   . Encounter for hospice care discussion   . Acute respiratory failure with hypoxemia ([place]) 04/26/2020  . Seizure ([place]) 04/26/2020  . Brain metastasis ([place])   . Status epilepticus ([place]) 04/25/2020  . Gait disturbance 01/20/2017  . 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 Elevated alkaline phosphatase level 01/20/2017  . Multiple sclerosis ([place]) 05/28/2016  . Mixed emotional features as adjustment reaction 08/17/2013  . Chronic fatigue and malaise 02/04/2013  . Vitamin D deficiency 02/04/2013   PCP:  System, Provider Not In Pharmacy:   [facility] [phone] -  [place], [place] - [address] [place] [address] Phone: [phone] Fax: [place] [address] [place]  [address] Phone: [phone] Fax: [phone]     Social Determinants of Health (SDOH) Interventions    Readmission Risk Interventions No flowsheet data found.

## 2020-04-29 NOTE — Consult Note (Signed)
Consultation Note Date: 04/29/2020   Patient Name: Michele Sawyer  DOB: 1961/02/17  MRN: [id number]  Age / Sex: 59 y.o., female  PCP: System, Provider Not In Referring Physician: [place] Shi, MD  Reason for Consultation: Disposition and Establishing goals of care  HPI/Patient Profile: 59 y.o. 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  She required intubation and was transferred from [facility] to [facility].   Neurology was consulted she was treated with low-dose benzodiazepine and Keppra, and Vimpat added  after routine EEG revealed evidence of epileptogenicity in the left hemisphere.   She was successfully extubated and appears comfortable.  Clinical Assessment and Goals of Care:  I have reviewed medical records including EPIC notes, labs and imaging, received report from the care team, examined the patient and met at bedside with her sister Michele Sawyer and spoke on the phone with her husband  to discuss diagnosis prognosis, Michele Sawyer, EOL wishes, disposition and options.  I introduced Palliative Medicine as specialized medical care for people living with serious illness. It focuses on providing relief from the symptoms and stress of a serious illness.   We discussed a brief life review of the patient.  She is from [place] and moved to [place] for her husband.  They have 1 daughter, Michele Sawyer - who has blessed them with 5 grandsons.  As far as functional and nutritional status Mrs.  Sawyer is eating very little and tells me she simply isn't hungry.  She is quite weak and appears to need a great deal of assistance in order to get out of bed.  Hospice and Palliative Care services outpatient were explained and offered.   Michele Sawyer and her sister feel that Hospice services would be more appropriate.  Her husband agrees with nursing that Michele Sawyer will need 24 hour care and will be best served at [facility].  We discussed the type of care Michele Sawyer would receive there.   I provided reassurance that we wanted expertise immediately available in case Michele Sawyer had any difficulty.  Questions and concerns were addressed.  The family was encouraged to call with questions or concerns.   Primary Decision Maker:  NEXT OF KIN Husband.    SUMMARY OF RECOMMENDATIONS    Patient appears comfortable.  Will add PRN comfort meds in case they are needed. Husband requests [facility].  This has been conveyed to Social Work and have done the referral.  Code Status/Advance Care Planning:  DNR   Symptom Management:   Anticonvulsant medications will need to be continued in the hospital and at [facility]  Additional Recommendations (Limitations, Scope, Preferences):  Full Comfort Care  Palliative Prophylaxis:   seizure, delirium.  Psycho-social/Spiritual:   Desire for further Chaplaincy support: requested.   She is [employer].  Prognosis:  Less than 2 weeks due to wide spread metastatic cancer.  Discharge Planning: Hospice facility      Primary Diagnoses: Present on Admission: . Status epilepticus (Michele Sawyer) . Acute respiratory failure with hypoxemia (Michele Sawyer)   I have reviewed the medical record, interviewed the patient and family, and examined the patient. The following aspects are pertinent.  Past Medical History:  Diagnosis Date  . Cancer [facility])    metastatic adenocarcinoma  . Liver disease   . Multiple sclerosis ([place])   . Vision abnormalities    Social History   Socioeconomic History  . Marital status: Married    Spouse name: Not on file  . Number of children: Not on file  . Years of education: Not on file  . Highest education level: Not on file  Occupational History    . Not on file  Tobacco Use  . Smoking status: Current Every Day Smoker    Types: Cigarettes  . Smokeless tobacco: Never Used  Substance and Sexual Activity  . Alcohol use: No  . Drug use: No  . Sexual activity: Not on file  Other Topics Concern  . Not on file  Social History Narrative  . Not on file   Social Determinants of Health   Financial Resource Strain:   . Difficulty of Paying Living Expenses: Not on file  Food Insecurity:   . Worried About Charity fundraiser in the Last Year: Not on file  . Ran Out of Food in the Last Year: Not on file  Transportation Needs:   . Lack of Transportation (Medical): Not on file  . Lack of Transportation (Non-Medical): Not on file  Physical Activity:   . Days of Exercise per Week: Not on file  . Minutes of Exercise per Session: Not on file  Stress:   . Feeling of Stress : Not on file  Social Connections:   . Frequency of Communication with Friends and Family: Not on file  . Frequency of Social Gatherings with Friends and Family: Not on file  . Attends Religious Services: Not on file  . Active Member of Clubs or Organizations: Not on file  . Attends Archivist Meetings: Not on file  . Marital Status: Not on file   Family History  Problem Relation Age of Onset  . Diabetes Mother   . Bone cancer Father     Allergies  Allergen Reactions  . Penicillins Swelling    Swell rash    Vital Signs: BP 137/71   Pulse 65   Temp 99.3 F (37.4 C) (Oral)   Resp 18   Ht 5' 7"  (1.702 m)   Wt 57.5 kg   SpO2 94%   BMI 19.85 kg/m  Pain Scale: 0-10   Pain Score: 0-No pain   SpO2: SpO2: 94 % O2 Device:SpO2: 94 % O2 Flow Rate: .O2 Flow Rate (L/min): 2 L/min    Palliative Assessment/Data: 20%     Time In: 3:10 Time Out: 4:10' Total Time:  60 min. Visit consisted of counseling and education dealing with the complex and emotionally intense issues surrounding the need for palliative care and symptom management in the  setting of serious and potentially life-threatening illness. Greater than 50%  of this time was spent counseling and coordinating care related to the above assessment and plan.  Signed by: Michele Jenny, PA-C Palliative Medicine  Please contact Palliative Medicine Team phone at [phone] for questions and concerns.  For individual provider: See Shea Carvin

## 2020-04-29 NOTE — Progress Notes (Signed)
Nutrition Brief Note  Chart reviewed. Pt now transitioning to comfort care.  No further nutrition interventions warranted at this time.  Please re-consult as needed.   Amiayah Giebel, MS, RD, LDN RD pager number and weekend/on-call pager number located in Amion.    

## 2020-04-29 NOTE — Progress Notes (Signed)
Michele Sawyer is now extubated.  She is up on 3 W.  She actually looks better than I would have thought.  She is somewhat alert.  She was able to talk to me a little bit.  She seems very comfortable.  She is quite deconditioned.  I am just happy that she is not having any respiratory distress.  It is hard to say how much she will be able to eat.  Hopefully there will be any problems with aspiration.  She clearly needs Hospice.  At least while she is in the hospital, palliative care can see her and start the transition for her to go home.  If, for some reason, she cannot go home, then I would consider the Hospice home of [facility].  She has a feeding tube taken out of her.  I think this was a great idea.  Her vital signs show temperature of 98.6.  Pulse 62.  Blood pressure 132/62.  Oxygen saturations 95%.  Her lungs sound relatively clear bilaterally.  She has good air movement bilaterally.  Cardiac exam regular rate and rhythm.  Abdomen is soft.  Bowel sounds are decreased.  There is no fluid wave.  Extremities shows some possible decreased strength over on the left side.  Skin exam does show some ecchymoses.  Again, we are now in comfort care mode for Michele Sawyer.  The next goal is to try to get her home if possible.  I am sure she and her family would like to have her go home.  Hospice will be able to follow her up at home.  It is just wonderful that she was able to be extubated and can be interactive with her family.  I know this is very important for them.  I appreciate the great care that she received down to the ICU.  I know she will receive wonderful care from all the staff upon 3 W.  Michele Haw, MD  2 [address] [place] 4:6-8

## 2020-04-29 NOTE — Care Management Important Message (Signed)
Important Message  Patient Details IM Letter given to the Patient Name: Michele Sawyer MRN: [id number] Date of Birth: 18-Sep-1960   Medicare Important Message Given:  Yes     Kerin Salen 04/29/2020, 10:39 AM

## 2020-04-29 NOTE — Progress Notes (Signed)
Brief Neuro update and signoff note:  Patient transitioned to comfort care. Team assessing for inpatient vs home hospice. Given multiple Brain mets, she is at risk of breakthrough seizures and cerebral edema. Would recommend continuing Keppra, Vimpat and Dexamethasone to prevent her from having seizures.  Recs: - Continue Keppra 1Gram BID - can be transitioned to IV Keppra 1gram BID if she is unable to tolerate PO - Continue Vimpat 200mg  BID - can be transitioned to IV Vimpat 200mg  BID if she is unable to tolerate PO - Continue Dexamethasone 4mg  every 6 hours. Can be transitioned to IV Dexamethasone 4mg  Q6H if unable to tolerate PO. - GI prophylaxis with PPI while on steroids. - Please have Ativan 2mg  IV once PRN on board for seizure lasting more than 2 mins. - Neurology inpatient team will signoff. Please feel free to contact us with any questions or concerns. Our schedule can be accessed on Amion.  [place] Pager Number [phone]

## 2020-04-29 NOTE — Progress Notes (Addendum)
PROGRESS NOTE    Michele Sawyer  BMW:[id number]  DOB: [birth year]  PCP: System, Provider Not In [place] date:04/25/2020 Chief compliant: Seizures 59 y.o. 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  She was started on IV Keppra, Ativan as needed, required intubation on initial presentation in the ED and was transferred from [facility] to [facility].  She also was started on IV fluids, propofol GTT, 10 mg Decadron and admitted to ICU. CT head found to have 1 cm lesion with surrounding edema and minor mass effect.  Neurology was consulted she was treated with low-dose benzodiazepine and Keppra, and Vimpat added  after routine EEG revealed evidence of epileptogenicity in the left hemisphere. 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  Patient evaluated by oncology, Dr. Marin Olp who felt her prognosis was terminal and recommended hospice/palliative care.  She was successfully extubated on 8/29 after transitioning off Diprivan with plan to transition to comfort measures.  Seen by neurology for follow-up and recommended IV antiepileptics/IV steroids if patient unable to tolerate p.o.    Subjective:  Patient lethargic when seen during rounds this morning.  Noted that patient was more awake when seen by Dr. Marin Olp earlier today.  Family not present at bedside.  She is on room air.  Objective: Vitals:   04/29/20 0142 04/29/20 0601 04/29/20 1135 04/29/20 1442  BP: 135/75 132/62 (!) 127/56 137/71  Pulse: 74 62 66 65  Resp: 16 16 18 18   Temp: 99.2 F (37.3 C) 98.6 F (37 C) 98.7 F (37.1 C) 99.3 F (37.4 C)  TempSrc: Oral Oral Oral Oral  SpO2: 99% 95% 98% 94%  Weight:      Height:        Intake/Output Summary (Last 24 hours) at 04/29/2020 1736 Last  data filed at 04/29/2020 1447 Gross per 24 hour  Intake 1370.55 ml  Output 2450 ml  Net -1079.45 ml   Filed Weights   04/27/20 0007 04/27/20 0500 04/28/20 0500  Weight: 57.4 kg 57.7 kg 57.5 kg    Physical Examination: General: Cachectic female, minimally responsive to tactile stimuli, no acute distress noted Heart: S1-S2 heard, regular rate and rhythm, no murmurs.  Trace leg edema Lungs: Equal air entry bilaterally, no rhonchi or rales on exam, no accessory muscle use Abdomen: Bowel sounds heard, soft, mostly nontender (although grimaced at one point), nondistended. Extremities: Trace leg edema.  Dressing in place along left ankle.  No cyanosis or clubbing. Neurological: Awake alert oriented x3, no focal weakness or numbness, strength and sensations to crude touch intact Skin: Port-A-Cath along right anterior chest wall.  Allevyn patch in place along left ankle.      Data Reviewed: I have personally reviewed following labs and imaging studies  CBC: Recent Labs  Lab 04/25/20 1422 04/25/20 1422 04/25/20 1439 04/26/20 0548 04/27/20 0313 04/28/20 0435 04/29/20 0953  WBC 5.7  --   --  6.5 14.6* 11.1* 9.3  NEUTROABS 4.7  --   --   --   --   --  8.5*  HGB 10.4*   < > 11.2* 9.6* 10.9* 9.8* 9.3*  HCT 32.6*   < > 33.0* 30.4* 34.8* 30.8* 28.8*  MCV 96.7  --   --  95.3 96.4 94.8 94.4  PLT 227  --   --  226 177 141* 147*   < > = values in this interval not displayed.   Basic Metabolic Panel: Recent Labs  Lab 04/25/20 1422 04/25/20 1422 04/25/20 1439 04/26/20 0548 04/27/20 0313 04/28/20 0435 04/29/20 0953  NA 138   < > 142 139 139 137 138  K 3.0*   < > 3.6 3.1* 3.6 3.9 3.5  CL 104   < > 101 109 108 108 105  CO2 24  --   --  23 21* 23 25  GLUCOSE 107*   < > 101* 95 165* 143* 120*  BUN 12   < > 12 12 12 13 10   CREATININE 0.40*   < > 0.40* 0.39* 0.46 0.37* 0.40*  CALCIUM 8.2*  --   --  8.2* 8.2* 8.0* 8.1*  MG  --   --   --   --  1.8 2.3  --   PHOS  --   --   --   --  3.3  2.4*  --    < > = values in this interval not displayed.   GFR: Estimated Creatinine Clearance: 67 mL/min (A) (by C-G formula based on SCr of 0.4 mg/dL (L)). Liver Function Tests: Recent Labs  Lab 04/25/20 1422 04/26/20 0548 04/28/20 0435 04/29/20 0953  AST 16 14* 16 21  ALT 10 10 12 17   ALKPHOS 153* 137* 131* 131*  BILITOT 0.5 0.2* 0.3 0.5  PROT 5.8* 5.3* 5.6* 5.4*  ALBUMIN 2.8* 2.5* 2.3* 2.4*   No results for input(s): LIPASE, AMYLASE in the last 168 hours. No results for input(s): AMMONIA in the last 168 hours. Coagulation Profile: Recent Labs  Lab 04/25/20 1422  INR 1.0   Cardiac Enzymes: Recent Labs  Lab 04/28/20 0435  CKTOTAL 87  CKMB 1.3   BNP (last 3 results) No results for input(s): PROBNP in the last 8760 hours. HbA1C: No results for input(s): HGBA1C in the last 72 hours. CBG: Recent Labs  Lab 04/28/20 2358 04/29/20 0400 04/29/20 0743 04/29/20 1158 04/29/20 1633  GLUCAP 106* 103* 95 101* 109*   Lipid Profile: Recent Labs    04/28/20 0435  TRIG 90   Thyroid Function Tests: No results for input(s): TSH, T4TOTAL, FREET4, T3FREE, THYROIDAB in the last 72 hours. Anemia Panel: No results for input(s): VITAMINB12, FOLATE, FERRITIN, TIBC, IRON, RETICCTPCT in the last 72 hours. Sepsis Labs: Recent Labs  Lab 04/26/20 1423 04/26/20 1607 04/26/20 1802 04/28/20 0435  PROCALCITON <0.10  --   --  0.23  LATICACIDVEN  --  2.1* 1.3 1.7    Recent Results (from the past 240 hour(s))  SARS Coronavirus 2 by RT PCR (hospital order, performed in [facility] hospital lab) Nasopharyngeal Nasopharyngeal Swab     Status: None   Collection Time: 04/25/20  2:24 PM   Specimen: Nasopharyngeal Swab  Result Value Ref Range Status   SARS Coronavirus 2 NEGATIVE NEGATIVE Final    Comment: (NOTE) SARS-CoV-2 target nucleic acids are NOT DETECTED.  The SARS-CoV-2 RNA is generally detectable in upper and lower respiratory specimens during the acute phase of infection.  The lowest concentration of SARS-CoV-2 viral copies this assay can detect is 250 copies / mL. A negative result does not preclude SARS-CoV-2 infection and should not be used as the sole basis for treatment or other patient management decisions.  A negative result may occur with improper specimen collection / handling, submission of specimen other than nasopharyngeal swab, presence of viral mutation(s) within the areas targeted by this assay,  and inadequate number of viral copies (<250 copies / mL). A negative result must be combined with clinical observations, patient history, and epidemiological information.  Fact Sheet for Patients:   StrictlyIdeas.no  Fact Sheet for Healthcare Providers: BankingDealers.co.za  This test is not yet approved or  cleared by the [place] FDA and has been authorized for detection and/or diagnosis of SARS-CoV-2 by FDA under an Emergency Use Authorization (EUA).  This EUA will remain in effect (meaning this test can be used) for the duration of the COVID-19 declaration under Section 564(b)(1) of the Act, 21 U.S.C. section 360bbb-3(b)(1), unless the authorization is terminated or revoked sooner.  Performed at [facility], [address], [place], [place] [address]   Culture, blood (routine x 2)     Status: None (Preliminary result)   Collection Time: 04/26/20  2:22 PM   Specimen: Right Antecubital; Blood  Result Value Ref Range Status   Specimen Description RIGHT ANTECUBITAL  Final   Special Requests   Final    BOTTLES DRAWN AEROBIC AND ANAEROBIC Blood Culture adequate volume   Culture   Final    NO GROWTH 3 DAYS Performed at [facility], [address], [place], [place] [address]    Report Status PENDING  Incomplete  Culture, blood (routine x 2)     Status: None (Preliminary result)   Collection Time: 04/26/20  2:30 PM   Specimen: BLOOD RIGHT HAND  Result Value Ref Range Status   Specimen  Description BLOOD RIGHT HAND  Final   Special Requests   Final    BOTTLES DRAWN AEROBIC ONLY Blood Culture adequate volume   Culture   Final    NO GROWTH 3 DAYS Performed at [facility], [address], [place], [place] [address]    Report Status PENDING  Incomplete  SARS Coronavirus 2 by RT PCR (hospital order, performed in [place] hospital lab) Nasopharyngeal Nasopharyngeal Swab     Status: None   Collection Time: 04/26/20  2:54 PM   Specimen: Nasopharyngeal Swab  Result Value Ref Range Status   SARS Coronavirus 2 NEGATIVE NEGATIVE Final    Comment: (NOTE) SARS-CoV-2 target nucleic acids are NOT DETECTED.  The SARS-CoV-2 RNA is generally detectable in upper and lower respiratory specimens during the acute phase of infection. The lowest concentration of SARS-CoV-2 viral copies this assay can detect is 250 copies / mL. A negative result does not preclude SARS-CoV-2 infection and should not be used as the sole basis for treatment or other patient management decisions.  A negative result may occur with improper specimen collection / handling, submission of specimen other than nasopharyngeal swab, presence of viral mutation(s) within the areas targeted by this assay, and inadequate number of viral copies (<250 copies / mL). A negative result must be combined with clinical observations, patient history, and epidemiological information.  Fact Sheet for Patients:   StrictlyIdeas.no  Fact Sheet for Healthcare Providers: BankingDealers.co.za  This test is not yet approved or  cleared by the [place] FDA and has been authorized for detection and/or diagnosis of SARS-CoV-2 by FDA under an Emergency Use Authorization (EUA).  This EUA will remain in effect (meaning this test can be used) for the duration of the COVID-19 declaration under Section 564(b)(1) of the Act, 21 U.S.C. section 360bbb-3(b)(1), unless the authorization is  terminated or revoked sooner.  Performed at [facility], [address] [place], [place] [address]   MRSA PCR Screening     Status: None   Collection Time:  04/26/20 11:53 PM   Specimen: Nasal Mucosa; Nasopharyngeal  Result Value Ref Range Status   MRSA by PCR NEGATIVE NEGATIVE Final    Comment:        The GeneXpert MRSA Assay (FDA approved for NASAL specimens only), is one component of a comprehensive MRSA colonization surveillance program. It is not intended to diagnose MRSA infection nor to guide or monitor treatment for MRSA infections. Performed at [facility], [place] [address] [place], [place] [address]   Culture, respiratory (non-expectorated)     Status: None   Collection Time: 04/27/20  4:10 AM   Specimen: Sputum; Respiratory  Result Value Ref Range Status   Specimen Description   Final    SPU Performed at [place] [address] [place], [place] [address]    Special Requests   Final    NONE Performed at [facility], [place] [address] [place], [place] [address]    Gram Stain   Final    MODERATE WBC PRESENT,BOTH PMN AND MONONUCLEAR FEW GRAM POSITIVE COCCI IN PAIRS MODERATE GRAM NEGATIVE RODS Performed at [facility], [place] [address], [place], [place] [address]    Culture FEW PSEUDOMONAS AERUGINOSA  Final   Report Status 04/29/2020 FINAL  Final   Organism ID, Bacteria PSEUDOMONAS AERUGINOSA  Final      Susceptibility   Pseudomonas aeruginosa - MIC*    CEFTAZIDIME 4 SENSITIVE Sensitive     CIPROFLOXACIN <=0.25 SENSITIVE Sensitive     GENTAMICIN <=1 SENSITIVE Sensitive     IMIPENEM 2 SENSITIVE Sensitive     PIP/TAZO 8 SENSITIVE Sensitive     CEFEPIME 2 SENSITIVE Sensitive     * FEW PSEUDOMONAS AERUGINOSA      Radiology Studies: DG CHEST PORT 1 VIEW  Result Date: 04/28/2020 CLINICAL DATA:  Status post intubation. EXAM: PORTABLE CHEST 1 VIEW COMPARISON:  04/27/2020. FINDINGS: The  endotracheal tube tip is above the carina. Right chest wall port a catheter tip is at the cavoatrial junction. Nasogastric tube stable in position. Normal cardiomediastinal contours. No pleural effusion identified. No airspace consolidation. Left upper lobe lung nodule is obscured by overlying support apparatus. IMPRESSION: ET tube tip is in satisfactory position above the carina. Electronically Signed   By: Kerby Moors M.D.   On: 04/28/2020 08:20      Scheduled Meds: . baclofen  5 mg Per Tube BID  . Chlorhexidine Gluconate Cloth  6 each Topical Q0600  . dexamethasone (DECADRON) injection  4 mg Intravenous Q6H  . docusate  100 mg Oral BID  . famotidine  20 mg Per Tube BID  . LORazepam  0.5 mg Oral QHS  . polyethylene glycol  17 g Oral Daily  . sodium chloride flush  10-40 mL Intracatheter Q12H  . venlafaxine  75 mg Per Tube BID WC   Continuous Infusions: . dextrose 5% lactated ringers 50 mL/hr at 04/29/20 0601  . lacosamide (VIMPAT) IV 200 mg (04/29/20 1040)  . levETIRAcetam 1,000 mg (04/29/20 1102)      Assessment/Plan:  1.  Status epilepticus: Seen by neurology during the hospital course and recommends continuing IV antiepileptics (if unable to tolerate p.o.) in view of comfort.  Currently IV Keppra/Vimpat and Ativan as needed on board.  Also on IV Decadron for cerebral mets.  2.  Acute metabolic encephalopathy: In the setting of cerebral mets.  On IV Decadron.  To be continued per neurology.  Minimally communicative and lethargic during my rounds.  Continue  comfort measures.  3.  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  She was following Dr Pablo Ledger and Chinnasami at [facility] previously.  Seen by Dr. Marin Olp here who feels her prognosis is terminal.  Recommended comfort measures.  4.  Multiple sclerosis: Will discontinue baclofen given seizure potential.  Continue comfort measures  5.  Acute respiratory failure: In the setting of cerebral mets and status  epilepticus present on admission requiring intubation.  Now extubated and on room air.  6.  Hypoalbuminemia: In the setting of metastatic cancer.  Comfort measures now.    7. Depression: Will DC venlafaxine with comfort goals.    8. End-of-life care: Seen by palliative care team and initiated on comfort measures.  They have discussed with family and have made referral to [place], social worker arranging.  CODE STATUS is DNR.   DVT prophylaxis: Comfort care Code Status: DNR Family / Patient Communication: Not present at bedside.  Palliative care discussing with family Disposition Plan:   Status is: Inpatient  Remains inpatient appropriate because:Unsafe d/c plan, pending hospice home bed, IV ongoing treatments   Dispo: The patient is from: Home              Anticipated d/c is to: Hospice home              Anticipated d/c date is: 1 day              Patient currently is medically stable to d/c.          Time spent: 25 minutes    >50% time spent in discussions with care team and coordination of care.    Guilford Shi, MD [facility] Pager in [place]  If 7PM-7AM, please contact night-coverage [email or web address] 04/29/2020, 5:36 PM

## 2020-04-30 DIAGNOSIS — G35 Multiple sclerosis: Secondary | ICD-10-CM

## 2020-04-30 DIAGNOSIS — Z7189 Other specified counseling: Secondary | ICD-10-CM

## 2020-04-30 DIAGNOSIS — R531 Weakness: Secondary | ICD-10-CM

## 2020-04-30 DIAGNOSIS — Z515 Encounter for palliative care: Secondary | ICD-10-CM

## 2020-04-30 LAB — SARS CORONAVIRUS 2 BY RT PCR (HOSPITAL ORDER, PERFORMED IN ~~LOC~~ HOSPITAL LAB): SARS Coronavirus 2: NEGATIVE

## 2020-04-30 MED ORDER — GLYCOPYRROLATE 1 MG PO TABS: 1.0000 mg | ORAL_TABLET | ORAL | Status: AC | PRN

## 2020-04-30 MED ORDER — POLYETHYLENE GLYCOL 3350 17 G PO PACK: 17.0000 g | PACK | Freq: Every day | ORAL | 0 refills | Status: AC | PRN

## 2020-04-30 MED ORDER — DEXAMETHASONE 4 MG PO TABS: 4.0000 mg | ORAL_TABLET | Freq: Four times a day (QID) | ORAL | 0 refills | Status: AC

## 2020-04-30 MED ORDER — BIOTENE DRY MOUTH MT LIQD: 15.0000 mL | OROMUCOSAL | Status: AC | PRN

## 2020-04-30 MED ORDER — POLYVINYL ALCOHOL 1.4 % OP SOLN: 1.0000 [drp] | Freq: Four times a day (QID) | OPHTHALMIC | 0 refills | Status: AC | PRN

## 2020-04-30 MED ORDER — LEVETIRACETAM 1000 MG PO TABS: 1000.0000 mg | ORAL_TABLET | Freq: Two times a day (BID) | ORAL | 1 refills | Status: AC

## 2020-04-30 MED ORDER — DOCUSATE SODIUM 100 MG PO CAPS: 100.0000 mg | ORAL_CAPSULE | Freq: Two times a day (BID) | ORAL | 0 refills | Status: AC | PRN

## 2020-04-30 MED ORDER — LACOSAMIDE 200 MG PO TABS: 200.0000 mg | ORAL_TABLET | Freq: Two times a day (BID) | ORAL | 0 refills | Status: AC

## 2020-04-30 MED ORDER — ONDANSETRON 4 MG PO TBDP: 4.0000 mg | ORAL_TABLET | Freq: Four times a day (QID) | ORAL | 0 refills | Status: AC | PRN

## 2020-04-30 NOTE — Plan of Care (Signed)
  Problem: Education: Goal: Knowledge of General Education information will improve Description: Including pain rating scale, medication(s)/side effects and non-pharmacologic comfort measures Outcome: Progressing   Problem: Pain Managment: Goal: General experience of comfort will improve Outcome: Progressing   

## 2020-04-30 NOTE — Progress Notes (Signed)
   04/30/20 1000  Clinical Encounter Type  Visited With Patient not available;Other (Comment)  Visit Type Initial;Psychological support;Spiritual support;Patient actively dying  Referral From Palliative care team  Consult/Referral To Chaplain  Spiritual Encounters  Spiritual Needs Emotional;Prayer;Other (Comment) (Prayer at the bedside; prayer shawl given)  Stress Factors  Patient Stress Factors Not reviewed   I visited [facility] per spiritual care consult. Shatarra was not awake at the time of my visit. I provided a prayer shawl on the bed and prayed at the bedside.    Chaplain Shanon Ace M.Div., [facility]

## 2020-04-30 NOTE — Discharge Summary (Addendum)
Physician Discharge Summary  Michele Sawyer:[id number] DOB: 21-Jan-1961 DOA: 04/25/2020  PCP: System, Provider Not In  Admit date: 04/25/2020 Discharge date: 04/30/2020 Consultations:Pulmonary Critical care, Neurology, Oncology and Palliative care Admitted From: home  Disposition: Hospice home  Discharge Diagnoses:  Principal Problem:   Status epilepticus ([place]) Active Problems:   Acute respiratory failure with hypoxemia ([place])   Seizure ([place])   Palliative care encounter   Encounter for hospice care discussion   Palliative care by specialist   Goals of care, counseling/discussion   General weakness   Hospital Course Summary:  59 y.o.femalewith history of metastatic adenocarinoma, 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 She was started on IV Keppra, Ativan as needed, required intubation on initial presentation in the ED and was transferred from [facility] to [facility].  She also was started on IV fluids, propofol GTT, 10 mg Decadron and admitted to ICU.CT head found to have 1 cm lesion with surrounding edema and minor mass effect.  Neurology was consulted she was treated with low-dose benzodiazepine and Keppra, and Vimpat added after routine EEG revealed evidence of epileptogenicity in the left hemisphere. 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  Patient evaluated by oncology, Dr. Marin Olp who felt her prognosis was terminal and recommended hospice/palliative care.  She was successfully extubated on 8/29 after transitioning off Diprivan with plan to transition to comfort measures.  Seen by neurology for follow-up and recommended IV antiepileptics/IV steroids if patient unable to tolerate orals.  1.  Status epilepticus: Seen by neurology during the hospital course and recommended  continuing IV antiepileptics (if unable to tolerate p.o.) in view of comfort. Patient much more awake and interactive with family. Able to tolerate liquid diet.  Currently IV Keppra/Vimpat-can transition to oral as able to take oral meds. Ativan as needed on board.  Also on IV Decadron for cerebral mets which can be transitioned to oral.  2.  Acute metabolic encephalopathy: In the setting of cerebral mets.  On IV Decadron-transition to orals on discharge as advised to be continued per neurology.  Minimally communicative but more awake today. Continue comfort measures. Husband understands that hospice home will not be able to do IV steroids if she were to decline again.   3.  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  She was following Dr Pablo Ledger and Ch[facility] at [facility] previously.  Seen by Dr. Marin Olp here who feels her prognosis is terminal.  Recommended comfort measures.  4.  Multiple sclerosis: Discontinued baclofen given seizure potential.  Continue comfort measures. Husband okay with discontinuing Gilenya  5.  Acute respiratory failure: In the setting of cerebral mets and status epilepticus present on admission requiring intubation.  Now extubated and on room air.  6.  Hypoalbuminemia: In the setting of metastatic cancer.  Comfort measures now.    7. Depression: can possibly DC venlafaxine with comfort goals-defer to hospice MD.    8. End-of-life care: Seen by palliative care team and initiated on comfort measures.  They have discussed with family and have made referral to [facility], social worker arranging transfer.  CODE STATUS is DNR. D/W Husband in detail regarding expectations at hospice home and he wishes to aim for her comfort, understands that IV antiepileptics and steroids would not be given at the facility if her mental  status declines and unable to take PO. Dr Ronne Binning also discussed with husband and reiterated comfort goals. Dr  Bradley Ferris advised to leave port accessed for IV ativan if needed.      Discharge Exam:   Vitals:   04/29/20 1135 04/29/20 1442 04/29/20 2112 04/30/20 0516  BP: (!) 127/56 137/71 138/64 136/62  Pulse: 66 65 67 68  Resp: 18 18 14 14   Temp: 98.7 F (37.1 C) 99.3 F (37.4 C) 98.4 F (36.9 C) 98.4 F (36.9 C)  TempSrc: Oral Oral    SpO2: 98% 94% 99% 98%  Weight:      Height:        General: Pt is awake, not in acute distress Cardiovascular: RRR, S1/S2 +, porta cath to [facility] Respiratory: CTA bilaterally, no wheezing, no rhonchi Abdominal: Soft, NT, ND, bowel sounds + Extremities: trace edema, no cyanosis  Discharge Condition:Stable CODE STATUS: DNR Diet recommendation: Full liquid diet, comfort feeds Recommendations for Outpatient Follow-up:  1. Follow up with PCP: Hospice MD Dr Bradley Ferris 2. Follow up with consultants:  3. Please obtain follow up labs including:   Home Health services upon discharge:  Equipment/Devices upon discharge: porta cath   Discharge Instructions:  Discharge Instructions    Call MD for:  difficulty breathing, headache or visual disturbances   Complete by: As directed    Call MD for:  persistant dizziness or light-headedness   Complete by: As directed    Call MD for:  persistant nausea and vomiting   Complete by: As directed    Call MD for:  severe uncontrolled pain   Complete by: As directed    Call MD for:  temperature >100.4   Complete by: As directed    Diet - low sodium heart healthy   Complete by: As directed    Increase activity slowly   Complete by: As directed      Allergies as of 04/30/2020      Reactions   Penicillins Swelling   Swell rash      Medication List    STOP taking these medications   baclofen 10 MG tablet Commonly known as: LIORESAL   calcium citrate-vitamin D 315-200 MG-UNIT tablet Commonly known as: CITRACAL+D   Gilenya 0.5 MG Caps Generic drug: Fingolimod HCl   ibuprofen 400 MG tablet Commonly known  as: ADVIL   phentermine 30 MG capsule   potassium chloride 10 MEQ tablet Commonly known as: KLOR-CON   Vitamin D3 75 MCG (3000 UT) Tabs     TAKE these medications   antiseptic oral rinse Liqd Apply 15 mLs topically as needed for dry mouth.   dexamethasone 4 MG tablet Commonly known as: DECADRON Take 1 tablet (4 mg total) by mouth every 6 (six) hours.   docusate sodium 100 MG capsule Commonly known as: COLACE Take 1 capsule (100 mg total) by mouth 2 (two) times daily as needed for mild constipation.   glycopyrrolate 1 MG tablet Commonly known as: ROBINUL Take 1 tablet (1 mg total) by mouth every 4 (four) hours as needed (excessive secretions).   lacosamide 200 MG Tabs tablet Commonly known as: Vimpat Take 1 tablet (200 mg total) by mouth 2 (two) times daily.   levETIRAcetam 1000 MG tablet Commonly known as: Keppra Take 1 tablet (1,000 mg total) by mouth 2 (two) times daily.   ondansetron 4 MG disintegrating tablet Commonly known as: ZOFRAN-ODT Take 1 tablet (4 mg total) by mouth every 6 (six) hours as needed for nausea.   oxyCODONE 5  MG immediate release tablet Commonly known as: Oxy IR/ROXICODONE Take 5 mg by mouth every 4 (four) hours as needed for severe pain.   polyethylene glycol 17 g packet Commonly known as: MIRALAX / GLYCOLAX Take 17 g by mouth daily as needed for moderate constipation.   polyvinyl alcohol 1.4 % ophthalmic solution Commonly known as: LIQUIFILM TEARS Place 1 drop into both eyes 4 (four) times daily as needed for dry eyes.   venlafaxine XR 37.5 MG 24 hr capsule Commonly known as: EFFEXOR-XR Take 75 mg by mouth daily with breakfast.       Allergies  Allergen Reactions  . Penicillins Swelling    Swell rash      The results of significant diagnostics from this hospitalization (including imaging, microbiology, ancillary and laboratory) are listed below for reference.    Labs: BNP (last 3 results) No results for input(s): BNP in  the last 8760 hours. Basic Metabolic Panel: Recent Labs  Lab 04/25/20 1422 04/25/20 1422 04/25/20 1439 04/26/20 0548 04/27/20 0313 04/28/20 0435 04/29/20 0953  NA 138   < > 142 139 139 137 138  K 3.0*   < > 3.6 3.1* 3.6 3.9 3.5  CL 104   < > 101 109 108 108 105  CO2 24  --   --  23 21* 23 25  GLUCOSE 107*   < > 101* 95 165* 143* 120*  BUN 12   < > 12 12 12 13 10   CREATININE 0.40*   < > 0.40* 0.39* 0.46 0.37* 0.40*  CALCIUM 8.2*  --   --  8.2* 8.2* 8.0* 8.1*  MG  --   --   --   --  1.8 2.3  --   PHOS  --   --   --   --  3.3 2.4*  --    < > = values in this interval not displayed.   Liver Function Tests: Recent Labs  Lab 04/25/20 1422 04/26/20 0548 04/28/20 0435 04/29/20 0953  AST 16 14* 16 21  ALT 10 10 12 17   ALKPHOS 153* 137* 131* 131*  BILITOT 0.5 0.2* 0.3 0.5  PROT 5.8* 5.3* 5.6* 5.4*  ALBUMIN 2.8* 2.5* 2.3* 2.4*   No results for input(s): LIPASE, AMYLASE in the last 168 hours. No results for input(s): AMMONIA in the last 168 hours. CBC: Recent Labs  Lab 04/25/20 1422 04/25/20 1422 04/25/20 1439 04/26/20 0548 04/27/20 0313 04/28/20 0435 04/29/20 0953  WBC 5.7  --   --  6.5 14.6* 11.1* 9.3  NEUTROABS 4.7  --   --   --   --   --  8.5*  HGB 10.4*   < > 11.2* 9.6* 10.9* 9.8* 9.3*  HCT 32.6*   < > 33.0* 30.4* 34.8* 30.8* 28.8*  MCV 96.7  --   --  95.3 96.4 94.8 94.4  PLT 227  --   --  226 177 141* 147*   < > = values in this interval not displayed.   Cardiac Enzymes: Recent Labs  Lab 04/28/20 0435  CKTOTAL 87  CKMB 1.3   BNP: Invalid input(s): POCBNP CBG: Recent Labs  Lab 04/28/20 2358 04/29/20 0400 04/29/20 0743 04/29/20 1158 04/29/20 1633  GLUCAP 106* 103* 95 101* 109*   D-Dimer No results for input(s): DDIMER in the last 72 hours. Hgb A1c No results for input(s): HGBA1C in the last 72 hours. Lipid Profile Recent Labs    04/28/20 0435  TRIG 90   Thyroid function studies  No results for input(s): TSH, T4TOTAL, T3FREE, THYROIDAB in  the last 72 hours.  Invalid input(s): FREET3 Anemia work up No results for input(s): VITAMINB12, FOLATE, FERRITIN, TIBC, IRON, RETICCTPCT in the last 72 hours. Urinalysis    Component Value Date/Time   COLORURINE YELLOW 04/25/2020 1422   APPEARANCEUR CLEAR 04/25/2020 1422   LABSPEC 1.016 04/25/2020 1422   PHURINE 7.0 04/25/2020 1422   GLUCOSEU NEGATIVE 04/25/2020 1422   HGBUR LARGE (A) 04/25/2020 1422   BILIRUBINUR NEGATIVE 04/25/2020 1422   KETONESUR NEGATIVE 04/25/2020 1422   PROTEINUR NEGATIVE 04/25/2020 1422   NITRITE NEGATIVE 04/25/2020 1422   LEUKOCYTESUR TRACE (A) 04/25/2020 1422   Sepsis Labs Invalid input(s): PROCALCITONIN,  WBC,  LACTICIDVEN Microbiology Recent Results (from the past 240 hour(s))  SARS Coronavirus 2 by RT PCR (hospital order, performed in [place] hospital lab) Nasopharyngeal Nasopharyngeal Swab     Status: None   Collection Time: 04/25/20  2:24 PM   Specimen: Nasopharyngeal Swab  Result Value Ref Range Status   SARS Coronavirus 2 NEGATIVE NEGATIVE Final    Comment: (NOTE) SARS-CoV-2 target nucleic acids are NOT DETECTED.  The SARS-CoV-2 RNA is generally detectable in upper and lower respiratory specimens during the acute phase of infection. The lowest concentration of SARS-CoV-2 viral copies this assay can detect is 250 copies / mL. A negative result does not preclude SARS-CoV-2 infection and should not be used as the sole basis for treatment or other patient management decisions.  A negative result may occur with improper specimen collection / handling, submission of specimen other than nasopharyngeal swab, presence of viral mutation(s) within the areas targeted by this assay, and inadequate number of viral copies (<250 copies / mL). A negative result must be combined with clinical observations, patient history, and epidemiological information.  Fact Sheet for Patients:   StrictlyIdeas.no  Fact Sheet for  Healthcare Providers: BankingDealers.co.za  This test is not yet approved or  cleared by the [place] FDA and has been authorized for detection and/or diagnosis of SARS-CoV-2 by FDA under an Emergency Use Authorization (EUA).  This EUA will remain in effect (meaning this test can be used) for the duration of the COVID-19 declaration under Section 564(b)(1) of the Act, 21 U.S.C. section 360bbb-3(b)(1), unless the authorization is terminated or revoked sooner.  Performed at [facility], [address], [place], [place] [address]   Culture, blood (routine x 2)     Status: None (Preliminary result)   Collection Time: 04/26/20  2:22 PM   Specimen: Right Antecubital; Blood  Result Value Ref Range Status   Specimen Description RIGHT ANTECUBITAL  Final   Special Requests   Final    BOTTLES DRAWN AEROBIC AND ANAEROBIC Blood Culture adequate volume   Culture   Final    NO GROWTH 4 DAYS Performed at [facility], [address] [place], [place] [address]    Report Status PENDING  Incomplete  Culture, blood (routine x 2)     Status: None (Preliminary result)   Collection Time: 04/26/20  2:30 PM   Specimen: BLOOD RIGHT HAND  Result Value Ref Range Status   Specimen Description BLOOD RIGHT HAND  Final   Special Requests   Final    BOTTLES DRAWN AEROBIC ONLY Blood Culture adequate volume   Culture   Final    NO GROWTH 4 DAYS Performed at [facility], [address] [place], [place] [address]    Report Status PENDING  Incomplete  SARS Coronavirus 2 by RT PCR (hospital  order, performed in [facility] hospital lab) Nasopharyngeal Nasopharyngeal Swab     Status: None   Collection Time: 04/26/20  2:54 PM   Specimen: Nasopharyngeal Swab  Result Value Ref Range Status   SARS Coronavirus 2 NEGATIVE NEGATIVE Final    Comment: (NOTE) SARS-CoV-2 target nucleic acids are NOT DETECTED.  The SARS-CoV-2 RNA is generally detectable in upper and lower respiratory specimens  during the acute phase of infection. The lowest concentration of SARS-CoV-2 viral copies this assay can detect is 250 copies / mL. A negative result does not preclude SARS-CoV-2 infection and should not be used as the sole basis for treatment or other patient management decisions.  A negative result may occur with improper specimen collection / handling, submission of specimen other than nasopharyngeal swab, presence of viral mutation(s) within the areas targeted by this assay, and inadequate number of viral copies (<250 copies / mL). A negative result must be combined with clinical observations, patient history, and epidemiological information.  Fact Sheet for Patients:   StrictlyIdeas.no  Fact Sheet for Healthcare Providers: BankingDealers.co.za  This test is not yet approved or  cleared by the [place] FDA and has been authorized for detection and/or diagnosis of SARS-CoV-2 by FDA under an Emergency Use Authorization (EUA).  This EUA will remain in effect (meaning this test can be used) for the duration of the COVID-19 declaration under Section 564(b)(1) of the Act, 21 U.S.C. section 360bbb-3(b)(1), unless the authorization is terminated or revoked sooner.  Performed at [facility], [address] [place], [place] [address]   MRSA PCR Screening     Status: None   Collection Time: 04/26/20 11:53 PM   Specimen: Nasal Mucosa; Nasopharyngeal  Result Value Ref Range Status   MRSA by PCR NEGATIVE NEGATIVE Final    Comment:        The GeneXpert MRSA Assay (FDA approved for NASAL specimens only), is one component of a comprehensive MRSA colonization surveillance program. It is not intended to diagnose MRSA infection nor to guide or monitor treatment for MRSA infections. Performed at [facility], [place] [address], [place], [place] [address]   Culture, respiratory (non-expectorated)     Status: None    Collection Time: 04/27/20  4:10 AM   Specimen: Sputum; Respiratory  Result Value Ref Range Status   Specimen Description   Final    SPU Performed at [facility] [address], [place], [place] [address]    Special Requests   Final    NONE Performed at [facility], [place] [address], [place], [place] [address]    Gram Stain   Final    MODERATE WBC PRESENT,BOTH PMN AND MONONUCLEAR FEW GRAM POSITIVE COCCI IN PAIRS MODERATE GRAM NEGATIVE RODS Performed at [facility], [place] [address] [place], [place] [address]    Culture FEW PSEUDOMONAS AERUGINOSA  Final   Report Status 04/29/2020 FINAL  Final   Organism ID, Bacteria PSEUDOMONAS AERUGINOSA  Final      Susceptibility   Pseudomonas aeruginosa - MIC*    CEFTAZIDIME 4 SENSITIVE Sensitive     CIPROFLOXACIN <=0.25 SENSITIVE Sensitive     GENTAMICIN <=1 SENSITIVE Sensitive     IMIPENEM 2 SENSITIVE Sensitive     PIP/TAZO 8 SENSITIVE Sensitive     CEFEPIME 2 SENSITIVE Sensitive     * FEW PSEUDOMONAS AERUGINOSA  SARS Coronavirus 2 by RT PCR (hospital order, performed in [facility] hospital lab) Nasopharyngeal Nasopharyngeal Swab  Status: None   Collection Time: 04/30/20 12:30 PM   Specimen: Nasopharyngeal Swab  Result Value Ref Range Status   SARS Coronavirus 2 NEGATIVE NEGATIVE Final    Comment: (NOTE) SARS-CoV-2 target nucleic acids are NOT DETECTED.  The SARS-CoV-2 RNA is generally detectable in upper and lower respiratory specimens during the acute phase of infection. The lowest concentration of SARS-CoV-2 viral copies this assay can detect is 250 copies / mL. A negative result does not preclude SARS-CoV-2 infection and should not be used as the sole basis for treatment or other patient management decisions.  A negative result may occur with improper specimen collection / handling, submission of specimen other than nasopharyngeal swab, presence of viral mutation(s) within the areas  targeted by this assay, and inadequate number of viral copies (<250 copies / mL). A negative result must be combined with clinical observations, patient history, and epidemiological information.  Fact Sheet for Patients:   StrictlyIdeas.no  Fact Sheet for Healthcare Providers: BankingDealers.co.za  This test is not yet approved or  cleared by the [place] FDA and has been authorized for detection and/or diagnosis of SARS-CoV-2 by FDA under an Emergency Use Authorization (EUA).  This EUA will remain in effect (meaning this test can be used) for the duration of the COVID-19 declaration under Section 564(b)(1) of the Act, 21 U.S.C. section 360bbb-3(b)(1), unless the authorization is terminated or revoked sooner.  Performed at [facility], [place] [address] [place], [place] [address]     Procedures/Studies: EEG  Result Date: 04/27/2020 Lora Havens, MD     04/27/2020  6:32 PM MRN: [id number] Epilepsy Attending: Lora Havens Referring Physician/Provider: Dr Heath Lark Date: 04/27/2020 Duration:  23.37 mins  Patient history: 59yo F with seizures. EEG to evaluate for seizure.  Level of alertness:  comatose  AEDs during EEG study: LEV  Technical aspects: This EEG study was done with scalp electrodes positioned according to the 10-20 International system of electrode placement. Electrical activity was acquired at a sampling rate of 500Hz  and reviewed with a high frequency filter of 70Hz  and a low frequency filter of 1Hz . EEG data were recorded continuously and digitally stored.  Description:  EEG showed continuous generalized 3 to 6 Hz theta-delta slowing.  Hyperventilation and photic stimulation were not performed.    ABNORMALITY -Continuous slow, generalized IMPRESSION: This study is suggestive of severe diffuse encephalopathy non specific etiology. No seizures and epileptiform discharges were seen throughout the  recording. EEG appears improved compared to previous day.   Lora Havens   DG Chest 1 View  Result Date: 04/26/2020 CLINICAL DATA:  Fever.  Hypoxia EXAM: CHEST  1 VIEW COMPARISON:  April 25, 2020 chest radiograph; PET-CT April 17, 2020 FINDINGS: Endotracheal tube tip is 2.9 cm above the carina. Nasogastric tube tip and side port are below the diaphragm. Central catheter tip is in the superior vena cava. No pneumothorax. Ill-defined nodular opacity in the left upper lobe is partially obscured by monitor lead. No edema or airspace opacity. Heart size and pulmonary vascularity are normal. No adenopathy. There are calcified breast implants bilaterally. IMPRESSION: Tube and catheter positions as described without pneumothorax. Apparent mass in the left upper lobe is not well seen on this study, in part due to overlapping of monitor lead. No edema or airspace opacity. Stable cardiac silhouette. Electronically Signed   By: Lowella Grip III M.D.   On: 04/26/2020 14:44   DG Abd 1 View  Result Date: 04/27/2020 CLINICAL DATA:  Evaluate OG tube placement EXAM: ABDOMEN - 1 VIEW COMPARISON:  None. FINDINGS: The OG tube terminates in the lower lateral left abdomen, likely in the distal gastric body. IMPRESSION: The NG tube terminates in the stomach, probably the distal body. Electronically Signed   By: Dorise Bullion III M.D   On: 04/27/2020 15:59   MR Brain W and Wo Contrast  Result Date: 04/27/2020 CLINICAL DATA:  Seizure. History of metastatic adenocarcinoma. Abnormal CT head. EXAM: MRI HEAD WITHOUT AND WITH CONTRAST TECHNIQUE: Multiplanar, multiecho pulse sequences of the brain and surrounding structures were obtained without and with intravenous contrast. CONTRAST:  6 mL Gadovist IV COMPARISON:  CT head 04/25/2020 FINDINGS: Brain: Multiple metastatic deposits in the brain 7 mm metastatic deposit left lower occipital lobe 14 mm metastatic deposit left medial occipital lobe with evidence of prior  hemorrhage. 18 mm left parietal ring-enhancing metastatic deposit with surrounding edema and local mass-effect with evidence of mild prior hemorrhage. 10.5 mm metastatic deposit high left parietal lobe with surrounding edema with evidence of mild prior hemorrhage. 4 mm metastatic deposit right posterior parietal lobe 11 mm metastatic deposit right medial frontal lobe with surrounding edema. Generalized atrophy.  No midline shift.  Negative for hydrocephalus. Chronic microvascular ischemic changes in the white matter bilaterally. 3 x 10 mm acute infarct left posterior cerebellum with adjacent chronic infarct in the left cerebellum. Vascular: Normal arterial flow voids. Skull and upper cervical spine: No focal skeletal lesions. Sinuses/Orbits: Patient is intubated with the fluid level in the posterior nasopharynx. Mild mucosal edema paranasal sinuses. Mastoid clear. Negative orbit Other: None IMPRESSION: At least 6 metastatic deposits in the brain. There is a cluster metastatic deposits in the left parietal lobe with edema. Several lesions show evidence of prior hemorrhage. No midline shift. Generalized atrophy with chronic microvascular ischemic change Small acute infarct left cerebellum These results were called by telephone at the time of interpretation on 04/27/2020 at 3:27 pm to provider [facility], who verbally acknowledged these results. Electronically Signed   By: Franchot Gallo M.D.   On: 04/27/2020 15:28   DG CHEST PORT 1 VIEW  Result Date: 04/28/2020 CLINICAL DATA:  Status post intubation. EXAM: PORTABLE CHEST 1 VIEW COMPARISON:  04/27/2020. FINDINGS: The endotracheal tube tip is above the carina. Right chest wall port a catheter tip is at the cavoatrial junction. Nasogastric tube stable in position. Normal cardiomediastinal contours. No pleural effusion identified. No airspace consolidation. Left upper lobe lung nodule is obscured by overlying support apparatus. IMPRESSION: ET tube tip is in  satisfactory position above the carina. Electronically Signed   By: Kerby Moors M.D.   On: 04/28/2020 08:20   DG Chest Port 1 View  Result Date: 04/27/2020 CLINICAL DATA:  Intubated, short of breath EXAM: PORTABLE CHEST 1 VIEW COMPARISON:  04/26/2020, 04/25/2020 FINDINGS: Endotracheal tube tip is about 2.3 cm superior to the carina. Esophageal tube tip is below the diaphragm but incompletely visualized. Right-sided central venous port tip over the SVC. Ill-defined left upper lobe lung nodule. Streaky atelectasis at the left base. Stable cardiomediastinal silhouette. No pneumothorax. IMPRESSION: Endotracheal tube tip about 2.3 cm superior to the carina. Ill-defined left upper lobe lung nodule as before. Streaky atelectasis at the left base Electronically Signed   By: Donavan Foil M.D.   On: 04/27/2020 03:29   DG Chest Portable 1 View  Result Date: 04/25/2020 CLINICAL DATA:  Endotracheal and OG tube placement. EXAM: PORTABLE CHEST 1 VIEW COMPARISON:  PET CT 04/17/2020 FINDINGS: Endotracheal tube tip 2.3  cm from the carina. No orogastric tube is visualized. There is a right chest port with tip in the SVC. Heart is normal in size with normal mediastinal contours. Nodular opacity in the left upper lobe corresponds to nodule on PET. Central right infrahilar nodule on PET not well seen by radiograph. There is no pleural effusion or pneumothorax. Normal pulmonary vasculature. No acute osseous abnormalities are seen. IMPRESSION: 1. Endotracheal tube tip 2.3 cm from the carina. 2. No enteric tube is visualized. 3. Left upper lobe nodule corresponds to nodule on PET. Known right infrahilar nodule not well seen by radiograph Electronically Signed   By: Keith Rake M.D.   On: 04/25/2020 17:19   EEG adult  Result Date: 04/26/2020 Lora Havens, MD     04/26/2020  6:31 PM Patient Name: Michele Sawyer MRN: [id number] Epilepsy Attending: Lora Havens Referring Physician/Provider: Dr Heath Lark Date:  04/26/2020 Duration: 29.40 mins Patient history: 59yo F with seizures. EEG to evaluate for seizure. Level of alertness:  comatose AEDs during EEG study: LEV, Propofol Technical aspects: This EEG study was done with scalp electrodes positioned according to the 10-20 International system of electrode placement. Electrical activity was acquired at a sampling rate of 500Hz  and reviewed with a high frequency filter of 70Hz  and a low frequency filter of 1Hz . EEG data were recorded continuously and digitally stored. Description:  EEG showed continuous generalized 3 to 6 Hz theta-delta slowing. 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  Hyperventilation and photic stimulation were not performed.   ABNORMALITY -Continuous slow, generalized -Periodic epileptiform discharges, lateralized left hemisphere IMPRESSION: This study showed evidence of epileptogenicity arising from left hemisphere due to underlying structural abnormality. Additionally there is evidence of severe diffuse encephalopathy non specific etiology but likely secondary to sedation. No seizures were seen throughout the recording. Lora Havens   ECHOCARDIOGRAM COMPLETE  Result Date: 04/27/2020    ECHOCARDIOGRAM REPORT   Patient Name:   Michele Sawyer Date of Exam: 04/27/2020 Medical Rec #:  [id number]     Height:       67.0 in Accession #:    [id number]    Weight:       127.2 lb Date of Birth:  [birth year]     BSA:          1.668 m Patient Age:    59 years      BP:           123/57 mmHg Patient Gender: F             HR:           70 bpm. Exam Location:  Inpatient Procedure: 2D Echo, Color Doppler and Cardiac Doppler Indications:    Acute Respiratory Insufficiency R06.89;  History:        Patient has no prior history of Echocardiogram examinations.  Sonographer:    Raquel Sarna Senior RDCS Referring Phys: [id number] [facility]  Sonographer Comments: Technically difficult due to breast implants. Supine on artifiical  respirator. IMPRESSIONS  1. Left ventricular ejection fraction, by estimation, is 55 to 60%. The left ventricle has normal function. Left ventricular endocardial border not optimally defined to evaluate regional wall motion. Left ventricular diastolic parameters are indeterminate.  2. RV-RA gradient normal at 23 mmHg, unable to estimate CVP. Right ventricular systolic function is normal. The right ventricular size is normal.  3. The mitral valve is grossly normal. Trivial mitral valve regurgitation.  4. The aortic valve  was not well visualized. Aortic valve regurgitation is not visualized. FINDINGS  Left Ventricle: Left ventricular ejection fraction, by estimation, is 55 to 60%. The left ventricle has normal function. Left ventricular endocardial border not optimally defined to evaluate regional wall motion. The left ventricular internal cavity size was normal in size. There is no left ventricular hypertrophy. Left ventricular diastolic parameters are indeterminate. Right Ventricle: RV-RA gradient normal at 23 mmHg, unable to estimate CVP. The right ventricular size is normal. No increase in right ventricular wall thickness. Right ventricular systolic function is normal. Left Atrium: Left atrial size was normal in size. Right Atrium: Right atrial size was normal in size. Pericardium: There is no evidence of pericardial effusion. Presence of pericardial fat pad. Mitral Valve: The mitral valve is grossly normal. Trivial mitral valve regurgitation. Tricuspid Valve: The tricuspid valve is grossly normal. Tricuspid valve regurgitation is trivial. Aortic Valve: The aortic valve was not well visualized. Aortic valve regurgitation is not visualized. Pulmonic Valve: The pulmonic valve was not well visualized. Pulmonic valve regurgitation is not visualized. Aorta: The aortic root is normal in size and structure. Venous: IVC assessment for right atrial pressure unable to be performed due to mechanical ventilation. IAS/Shunts:  No atrial level shunt detected by color flow Doppler.  LEFT VENTRICLE PLAX 2D LVIDd:         3.60 cm  Diastology LVIDs:         2.60 cm  LV e' lateral:   8.81 cm/s LV PW:         0.70 cm  LV E/e' lateral: 8.8 LV IVS:        0.70 cm  LV e' medial:    5.44 cm/s LVOT diam:     2.00 cm  LV E/e' medial:  14.2 LV SV:         65 LV SV Index:   39 LVOT Area:     3.14 cm  RIGHT VENTRICLE RV S prime:     10.40 cm/s TAPSE (M-mode): 1.8 cm LEFT ATRIUM             Index       RIGHT ATRIUM           Index LA diam:        3.10 cm 1.86 cm/m  RA Area:     17.70 cm LA Vol (A2C):   39.0 ml 23.38 ml/m RA Volume:   49.00 ml  29.37 ml/m LA Vol (A4C):   34.2 ml 20.50 ml/m LA Biplane Vol: 37.0 ml 22.18 ml/m  AORTIC VALVE LVOT Vmax:   84.20 cm/s LVOT Vmean:  56.200 cm/s LVOT VTI:    0.206 m  AORTA Ao Root diam: 2.90 cm MITRAL VALVE               TRICUSPID VALVE MV Area (PHT): 3.85 cm    TR Peak grad:   23.0 mmHg MV Decel Time: 197 msec    TR Vmax:        240.00 cm/s MV E velocity: 77.10 cm/s MV A velocity: 45.40 cm/s  SHUNTS MV E/A ratio:  1.70        Systemic VTI:  0.21 m                            Systemic Diam: 2.00 cm Rozann Lesches MD Electronically signed by Rozann Lesches MD Signature Date/Time: 04/27/2020/4:11:40 PM    Final    CT HEAD CODE STROKE  WO CONTRAST  Result Date: 04/25/2020 CLINICAL DATA:  Code stroke. Right facial droop, suspected new seizure, history of MS and metastatic adenocarcinoma of unknown primary EXAM: CT HEAD WITHOUT CONTRAST TECHNIQUE: Contiguous axial images were obtained from the base of the skull through the vertex without intravenous contrast. COMPARISON:  Correlation made with prior MRI from 2020 FINDINGS: Motion artifact is present despite repeat scanning. Brain: There is a mildly hyperdense lesion of the parasagittal left parietal lobe near the calcarine sulcus measuring 1 cm. Surrounding hypoattenuation in the white matter of the parietal lobe likely reflects associated vasogenic edema.  Mass effect is minor. No acute intracranial hemorrhage. Gray-white differentiation appears preserved within above limitation. Prominence of the ventricles and sulci reflects generalized parenchymal volume loss. Patchy and confluent areas of hypoattenuation in the supratentorial white matter nonspecific but probably reflect chronic demyelination and microvascular ischemic changes. Vascular: No hyperdense vessel. Skull: Unremarkable. Sinuses/Orbits: No acute abnormality. Other: Mastoid air cells are clear. IMPRESSION: Motion degraded. 1 cm lesion of the parasagittal left parietal lobe with surrounding edema and minor mass effect. Most consistent with a metastasis. Contrast enhanced MRI is recommended for further evaluation. These results were called by telephone at the time of interpretation on 04/25/2020 at 2:55 pm to provider [facility] , who verbally acknowledged these results. Electronically Signed   By: Macy Mis M.D.   On: 04/25/2020 15:00    Time coordinating discharge: Over 30 minutes  SIGNED:   Guilford Shi, MD  [facility] 04/30/2020, 3:37 PM

## 2020-04-30 NOTE — Plan of Care (Signed)
  Problem: Education: Goal: Knowledge of General Education information will improve Description: Including pain rating scale, medication(s)/side effects and non-pharmacologic comfort measures Outcome: Progressing   Problem: Elimination: Goal: Will not experience complications related to urinary retention Outcome: Progressing   

## 2020-04-30 NOTE — Plan of Care (Signed)
  Problem: Education: Goal: Knowledge of General Education information will improve Description: Including pain rating scale, medication(s)/side effects and non-pharmacologic comfort measures 04/30/2020 2116 by Jerrol Banana, RN Outcome: Adequate for Discharge 04/30/2020 2055 by Jerrol Banana, RN Outcome: Progressing   Problem: Health Behavior/Discharge Planning: Goal: Ability to manage health-related needs will improve Outcome: Adequate for Discharge   Problem: Clinical Measurements: Goal: Ability to maintain clinical measurements within normal limits will improve Outcome: Adequate for Discharge Goal: Will remain free from infection Outcome: Adequate for Discharge Goal: Diagnostic test results will improve Outcome: Adequate for Discharge Goal: Respiratory complications will improve Outcome: Adequate for Discharge Goal: Cardiovascular complication will be avoided Outcome: Adequate for Discharge   Problem: Activity: Goal: Risk for activity intolerance will decrease Outcome: Adequate for Discharge   Problem: Nutrition: Goal: Adequate nutrition will be maintained Outcome: Adequate for Discharge   Problem: Coping: Goal: Level of anxiety will decrease Outcome: Adequate for Discharge   Problem: Elimination: Goal: Will not experience complications related to bowel motility Outcome: Adequate for Discharge Goal: Will not experience complications related to urinary retention 04/30/2020 2116 by Jerrol Banana, RN Outcome: Adequate for Discharge 04/30/2020 2055 by Jerrol Banana, RN Outcome: Progressing   Problem: Pain Managment: Goal: General experience of comfort will improve Outcome: Adequate for Discharge   Problem: Safety: Goal: Ability to remain free from injury will improve Outcome: Adequate for Discharge   Problem: Skin Integrity: Goal: Risk for impaired skin integrity will decrease Outcome: Adequate for Discharge  Pt D/C to hospice

## 2020-04-30 NOTE — Progress Notes (Signed)
Ms. Weissberg about the same.  It is hard to say how much she really is eating.  She has not had any seizures.  She appears to be comfortable.  Surprisingly, her prealbumin is probably better than I thought it was going to be.  It is 14.  I think that the goal to try to get her home with Hospice.  Hopefully this can be achieved.  She has had no obvious fever.  There has been no pain.  She has had no cough or shortness of breath.  She has the Foley catheter in place.  Given the fact that the prealbumin is better than I thought, her prognosis good certainly be longer than 2 weeks.  Again, she is comfortable.  I know that she is getting wonderful care from all the staff up on [place], MD  Psalms 140-6-7

## 2020-04-30 NOTE — Progress Notes (Addendum)
PMT progress note  Patient resting in bed, in no distress, chart reviewed, discussed with CSW colleague, awaiting residential hospice.   BP 136/62 (BP Location: Left Arm)   Pulse 68   Temp 98.4 F (36.9 C)   Resp 14   Ht 5\' 7"  (1.702 m)   Wt 57.5 kg   SpO2 98%   BMI 19.85 kg/m  Chart reviewed, labs and imaging noted.  Resting in bed, no distress, no non verbal signs of distress or discomfort.  Appreciate chaplain visit and follow up.  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  Continue comfort measures, recommend residential hospice.  15 minutes Loistine Chance MD [place] [facility].  [phone].

## 2020-04-30 NOTE — TOC Transition Note (Signed)
Transition of Care [facility]) - CM/SW Discharge Note   Patient Details  Name: Michele Sawyer MRN: [id number] Date of Birth: [birth year]  [facility]) CM/SW Contact:  Lennart Pall, LCSW Phone Number: 04/30/2020, 3:42 PM   Clinical Narrative:    Pt has been accepted to [facility] and spouse aware plan to transfer today via [place].  No further TOC needs.   Final next level of care: [place] Barriers to Discharge: Barriers Resolved   Patient Goals and CMS Choice     Choice offered to / list presented to : Spouse  Discharge Placement              Patient chooses bed at: Other - please specify in the comment section below: (Onset) Patient to be transferred to facility by: [place] Name of family member notified: spouse Patient and family notified of of transfer: 04/30/20  Discharge Plan and Services In-house Referral: Clinical Social Work   Post Acute Care Choice: Hospice          DME Arranged: N/A DME Agency: NA       HH Arranged: NA [place] Agency: NA        Social Determinants of Health (SDOH) Interventions     Readmission Risk Interventions No flowsheet data found.

## 2020-05-01 ENCOUNTER — Telehealth (HOSPITAL_BASED_OUTPATIENT_CLINIC_OR_DEPARTMENT_OTHER): Payer: Self-pay | Admitting: Emergency Medicine

## 2020-05-01 LAB — CULTURE, BLOOD (ROUTINE X 2)
Culture: NO GROWTH
Culture: NO GROWTH
Special Requests: ADEQUATE
Special Requests: ADEQUATE

## 2020-05-31 DEATH — deceased

## 2021-03-02 IMAGING — CT CT HEAD CODE STROKE
3 of 6 series · 15 of 47 positions shown, 18 images · non-contrast
Comparison: Correlation made with prior MRI from 2828

CLINICAL DATA: Code stroke. Right facial droop, suspected new
seizure, history of MS and metastatic adenocarcinoma of unknown
primary

EXAM:
CT HEAD WITHOUT CONTRAST
TECHNIQUE: Contiguous axial images were obtained from the base of the skull
through the vertex without intravenous contrast.

[Series 2: head w o · axial · 0.39mm/px · z∈[+28,+162]mm · 10 of 31 slices shown, 13 images]
[im 2/31  brain]
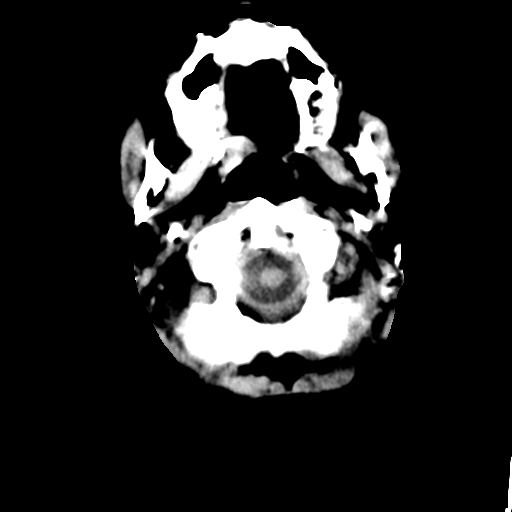
[im 2/31  bone]
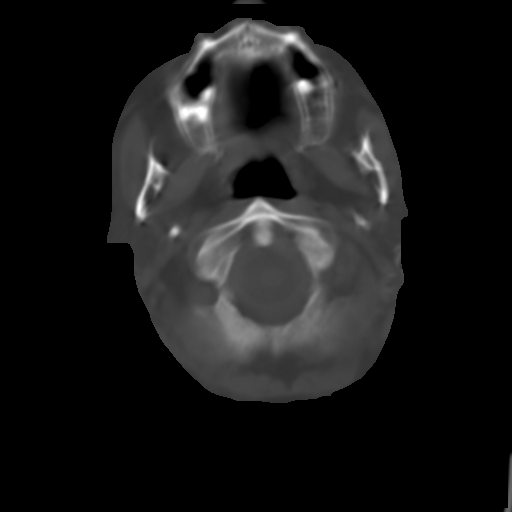
[im 6/31  brain]
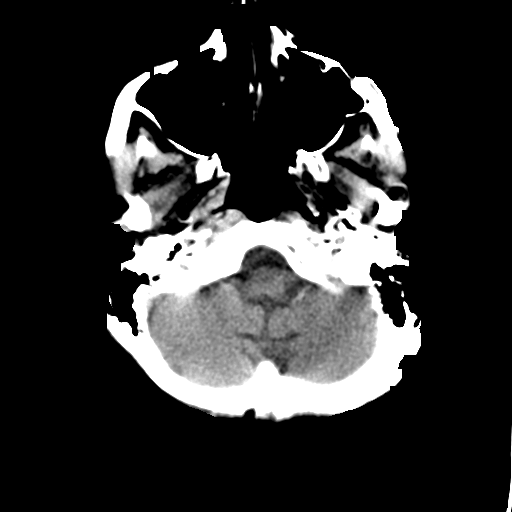
[im 8/31  brain]
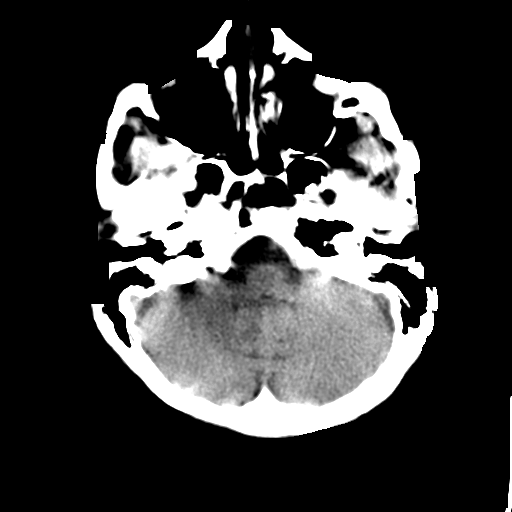
[im 12/31  brain]
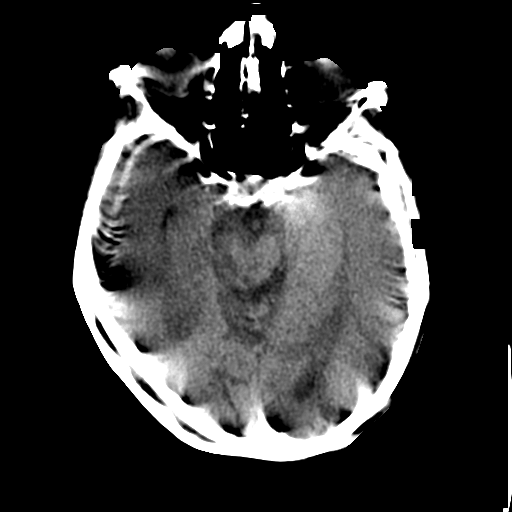
[im 14/31  brain]
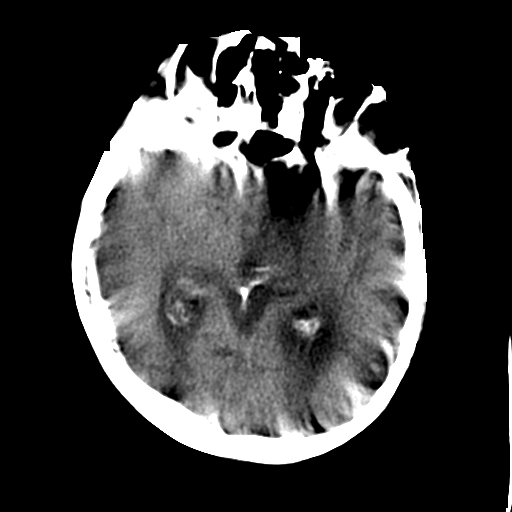
[im 14/31  bone]
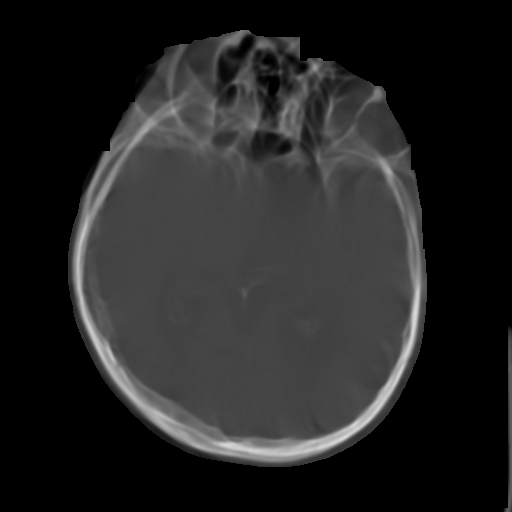
[im 17/31  brain]
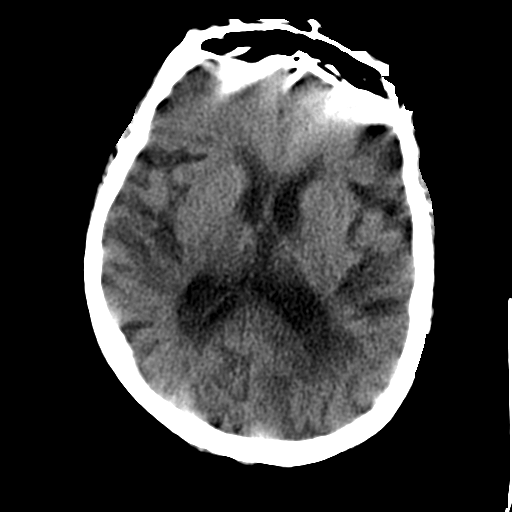
[im 19/31  brain]
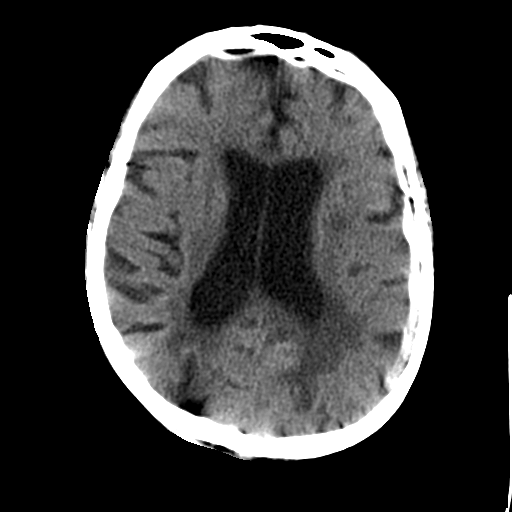
[im 23/31  brain]
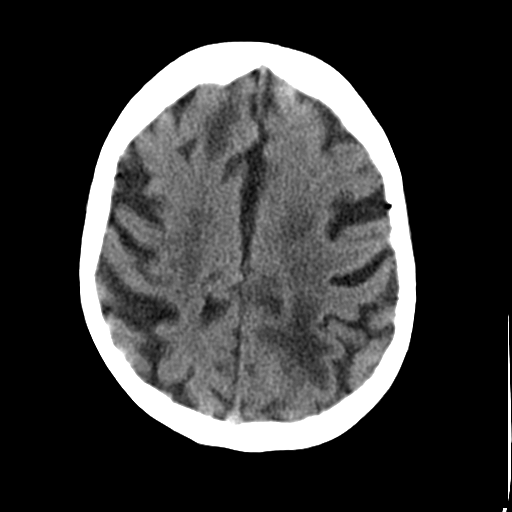
[im 25/31  brain]
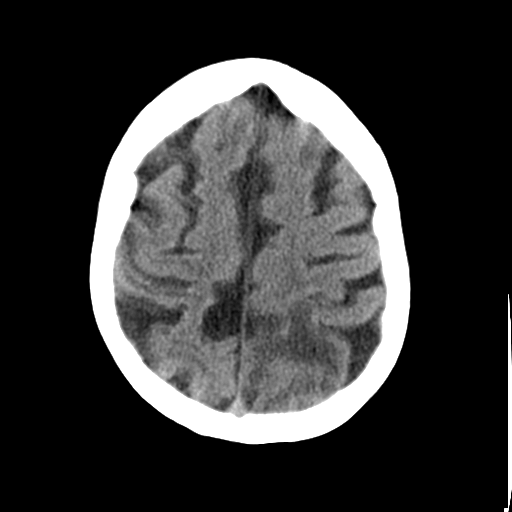
[im 25/31  bone]
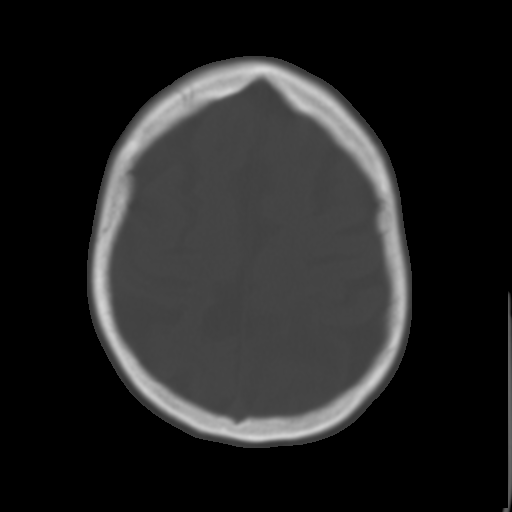
[im 29/31  brain]
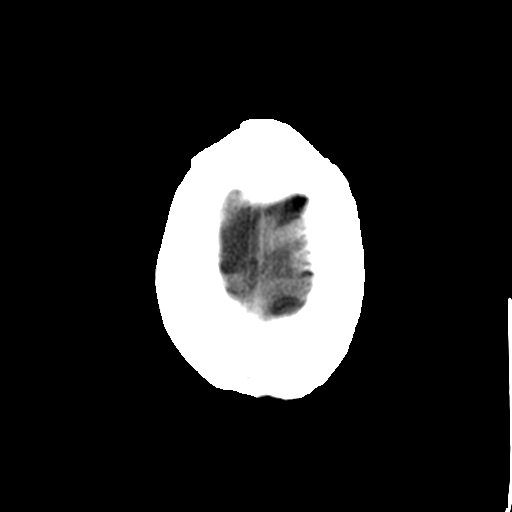

[Series 4: coronal soft · coronal · 0.35mm/px · 3 of 67 slices shown]
[im 16/67  brain]
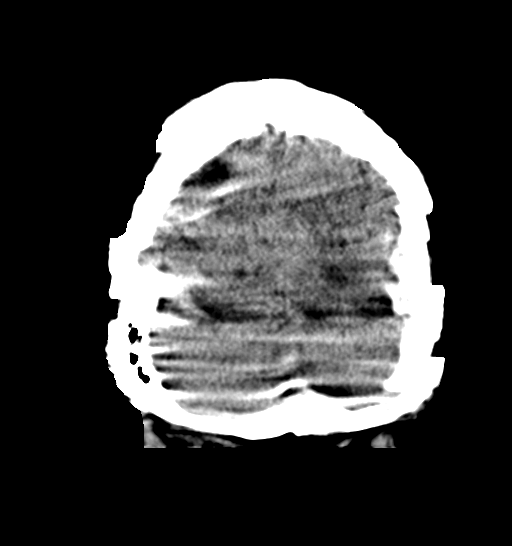
[im 32/67  brain]
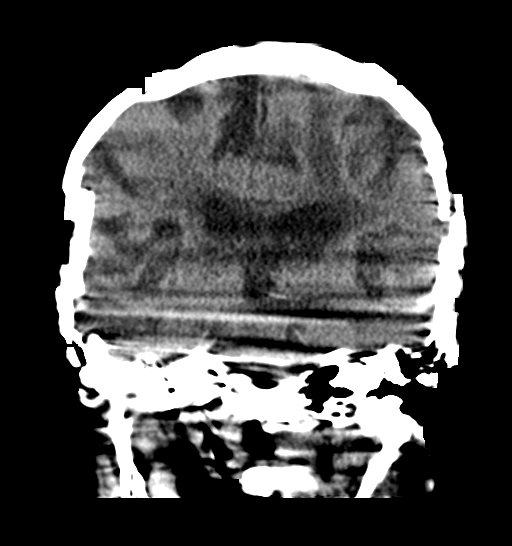
[im 47/67  brain]
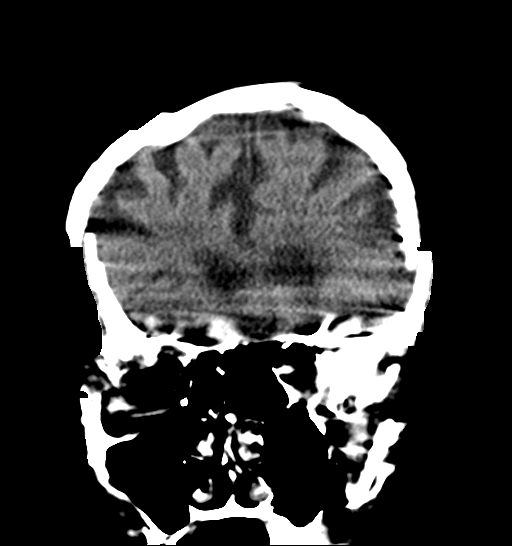

[Series 9: sagittal soft · sagittal · 0.31mm/px · 2 of 52 slices shown]
[im 18/52  brain]
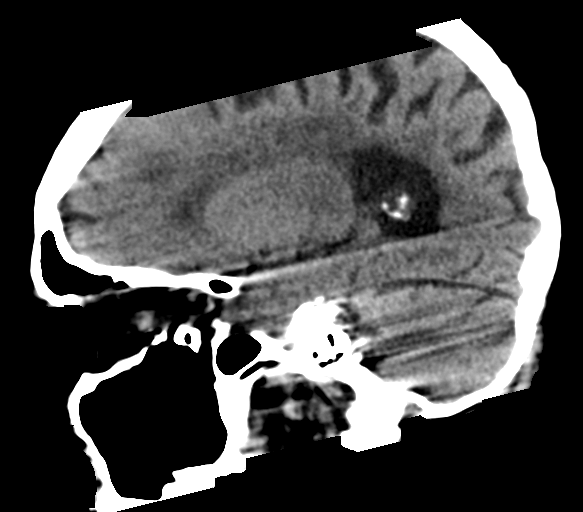
[im 35/52  brain]
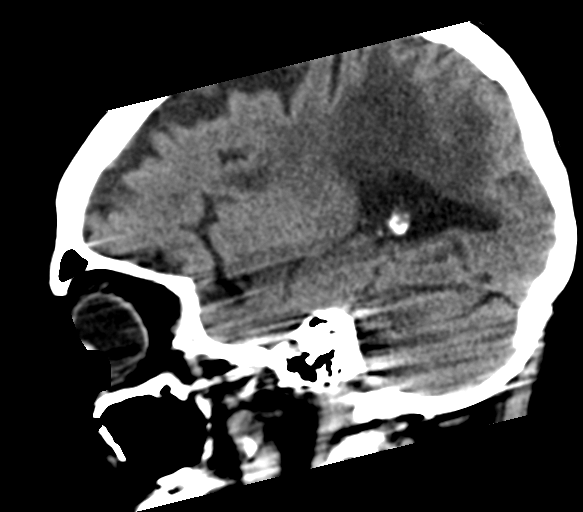

[15 of 47 positions shown; findings below may reference images not displayed]

FINDINGS: Motion artifact is present despite repeat scanning.

Brain: There is a mildly hyperdense lesion of the parasagittal left
parietal lobe near the calcarine sulcus measuring 1 cm. Surrounding
hypoattenuation in the white matter of the parietal lobe likely
reflects associated vasogenic edema. Mass effect is minor.

No acute intracranial hemorrhage. Gray-white differentiation appears
preserved within above limitation. Prominence of the ventricles and
sulci reflects generalized parenchymal volume loss. Patchy and
confluent areas of hypoattenuation in the supratentorial white
matter nonspecific but probably reflect chronic demyelination and
microvascular ischemic changes.

Vascular: No hyperdense vessel.

Skull: Unremarkable.

Sinuses/Orbits: No acute abnormality.

Other: Mastoid air cells are clear.
IMPRESSION: Motion degraded.

1 cm lesion of the parasagittal left parietal lobe with surrounding
edema and minor mass effect. Most consistent with a metastasis.
Contrast enhanced MRI is recommended for further evaluation.

These results were called by telephone at the time of interpretation
on 04/25/2020 at [DATE] to provider DIEF FELECITE , who verbally
acknowledged these results.

## 2021-03-05 IMAGING — DX DG CHEST 1V PORT
1 series · 1 of 1 positions shown · non-contrast
Comparison: 04/27/2020.

CLINICAL DATA: Status post intubation.

EXAM:
PORTABLE CHEST 1 VIEW

[chest ap]
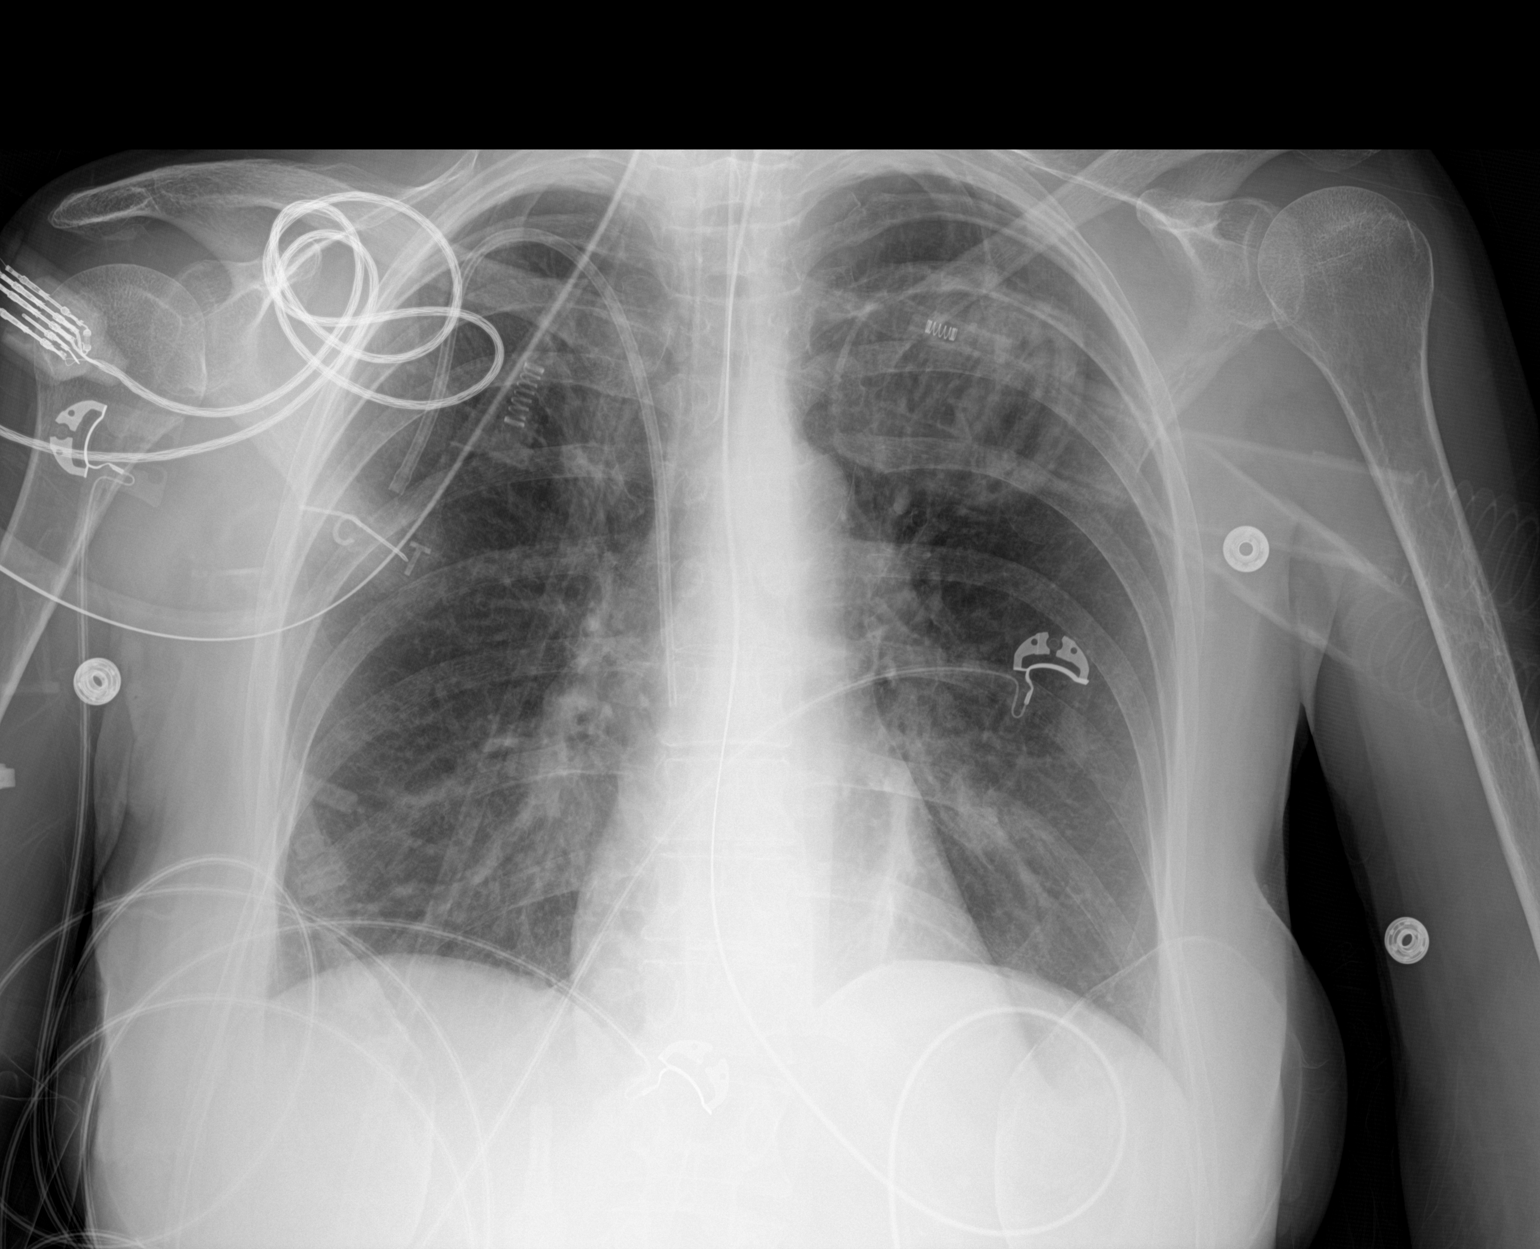

[1 of 1 positions shown; findings below may reference images not displayed]

FINDINGS: The endotracheal tube tip is above the carina. Right chest wall port
a catheter tip is at the cavoatrial junction. Nasogastric tube
stable in position. Normal cardiomediastinal contours. No pleural
effusion identified. No airspace consolidation. Left upper lobe lung
nodule is obscured by overlying support apparatus.
IMPRESSION: ET tube tip is in satisfactory position above the carina.
